# Patient Record
Sex: Female | Born: 1945 | Race: White | Hispanic: No | Marital: Married | Smoking: Never smoker
Health system: Southern US, Community
[De-identification: ages and names within clinical notes are randomized; demographics above are authoritative.]

## PROBLEM LIST (undated history)

## (undated) DIAGNOSIS — I213 ST elevation (STEMI) myocardial infarction of unspecified site: Secondary | ICD-10-CM

## (undated) DIAGNOSIS — I10 Essential (primary) hypertension: Secondary | ICD-10-CM

## (undated) DIAGNOSIS — E785 Hyperlipidemia, unspecified: Secondary | ICD-10-CM

## (undated) DIAGNOSIS — I5032 Chronic diastolic (congestive) heart failure: Secondary | ICD-10-CM

## (undated) DIAGNOSIS — E119 Type 2 diabetes mellitus without complications: Secondary | ICD-10-CM

## (undated) DIAGNOSIS — I2583 Coronary atherosclerosis due to lipid rich plaque: Secondary | ICD-10-CM

## (undated) DIAGNOSIS — I214 Non-ST elevation (NSTEMI) myocardial infarction: Secondary | ICD-10-CM

## (undated) DIAGNOSIS — I2510 Atherosclerotic heart disease of native coronary artery without angina pectoris: Secondary | ICD-10-CM

## (undated) HISTORY — DX: Essential (primary) hypertension: I10

## (undated) HISTORY — DX: Atherosclerotic heart disease of native coronary artery without angina pectoris: I25.10

## (undated) HISTORY — DX: Coronary atherosclerosis due to lipid rich plaque: I25.83

## (undated) HISTORY — DX: ST elevation (STEMI) myocardial infarction of unspecified site: I21.3

## (undated) HISTORY — DX: Type 2 diabetes mellitus without complications: E11.9

## (undated) HISTORY — DX: Hyperlipidemia, unspecified: E78.5

## (undated) HISTORY — DX: Chronic diastolic (congestive) heart failure: I50.32

## (undated) HISTORY — DX: Non-ST elevation (NSTEMI) myocardial infarction: I21.4

---

## 1999-03-21 ENCOUNTER — Other Ambulatory Visit: Admission: RE | Admit: 1999-03-21 | Discharge: 1999-03-21 | Payer: Self-pay | Admitting: *Deleted

## 2000-03-25 ENCOUNTER — Encounter: Admission: RE | Admit: 2000-03-25 | Discharge: 2000-03-25 | Payer: Self-pay | Admitting: Neurosurgery

## 2000-03-25 ENCOUNTER — Encounter: Payer: Self-pay | Admitting: Neurosurgery

## 2000-06-03 ENCOUNTER — Other Ambulatory Visit: Admission: RE | Admit: 2000-06-03 | Discharge: 2000-06-03 | Payer: Self-pay | Admitting: *Deleted

## 2002-07-06 DIAGNOSIS — I213 ST elevation (STEMI) myocardial infarction of unspecified site: Secondary | ICD-10-CM

## 2002-07-06 HISTORY — DX: ST elevation (STEMI) myocardial infarction of unspecified site: I21.3

## 2009-01-05 ENCOUNTER — Emergency Department: Payer: Self-pay | Admitting: Emergency Medicine

## 2017-07-05 DIAGNOSIS — E7800 Pure hypercholesterolemia, unspecified: Secondary | ICD-10-CM | POA: Insufficient documentation

## 2017-07-05 DIAGNOSIS — I10 Essential (primary) hypertension: Secondary | ICD-10-CM | POA: Insufficient documentation

## 2017-10-27 HISTORY — PX: CORONARY ARTERY BYPASS GRAFT: SHX141

## 2017-11-03 ENCOUNTER — Encounter
Admission: RE | Admit: 2017-11-03 | Discharge: 2017-11-03 | Disposition: A | Discharge status: Home / Self Care | Payer: Self-pay | Source: Ambulatory Visit | Attending: Internal Medicine | Admitting: Internal Medicine

## 2017-11-04 MED ORDER — TRAMADOL HCL 50 MG PO TABS
25.00 | ORAL_TABLET | ORAL | Status: DC
Start: ? — End: 2017-11-04

## 2017-11-04 MED ORDER — ATORVASTATIN CALCIUM 40 MG PO TABS
40.00 | ORAL_TABLET | ORAL | Status: DC
Start: 2017-11-05 — End: 2017-11-04

## 2017-11-04 MED ORDER — LIDOCAINE 5 % EX PTCH
2.00 | MEDICATED_PATCH | CUTANEOUS | Status: DC
Start: 2017-11-04 — End: 2017-11-04

## 2017-11-04 MED ORDER — METFORMIN HCL ER 500 MG PO TB24
1000.00 | ORAL_TABLET | ORAL | Status: DC
Start: 2017-11-04 — End: 2017-11-04

## 2017-11-04 MED ORDER — METOPROLOL TARTRATE 25 MG PO TABS
25.00 | ORAL_TABLET | ORAL | Status: DC
Start: 2017-11-04 — End: 2017-11-04

## 2017-11-04 MED ORDER — PANTOPRAZOLE SODIUM 40 MG PO TBEC
40.00 | DELAYED_RELEASE_TABLET | ORAL | Status: DC
Start: 2017-11-05 — End: 2017-11-04

## 2017-11-04 MED ORDER — GLUCAGON HCL RDNA (DIAGNOSTIC) 1 MG IJ SOLR
1.00 | INTRAMUSCULAR | Status: DC
Start: ? — End: 2017-11-04

## 2017-11-04 MED ORDER — FUROSEMIDE 20 MG PO TABS
20.00 | ORAL_TABLET | ORAL | Status: DC
Start: 2017-11-04 — End: 2017-11-04

## 2017-11-04 MED ORDER — ASPIRIN 81 MG PO CHEW
81.00 | CHEWABLE_TABLET | ORAL | Status: DC
Start: 2017-11-05 — End: 2017-11-04

## 2017-11-04 MED ORDER — POTASSIUM CHLORIDE ER 20 MEQ PO TBCR
20.00 | EXTENDED_RELEASE_TABLET | ORAL | Status: DC
Start: 2017-11-04 — End: 2017-11-04

## 2017-11-04 MED ORDER — DEXTROSE 50 % IV SOLN
12.50 | INTRAVENOUS | Status: DC
Start: ? — End: 2017-11-04

## 2017-11-04 MED ORDER — INSULIN REGULAR HUMAN 100 UNIT/ML IJ SOLN
.00 | INTRAMUSCULAR | Status: DC
Start: 2017-11-04 — End: 2017-11-04

## 2017-11-04 MED ORDER — ACETAMINOPHEN 325 MG PO TABS
650.00 | ORAL_TABLET | ORAL | Status: DC
Start: 2017-11-04 — End: 2017-11-04

## 2017-11-04 MED ORDER — MAGNESIUM OXIDE 400 MG PO TABS
400.00 | ORAL_TABLET | ORAL | Status: DC
Start: 2017-11-04 — End: 2017-11-04

## 2017-11-04 MED ORDER — GENERIC EXTERNAL MEDICATION
Status: DC
Start: ? — End: 2017-11-04

## 2017-11-05 ENCOUNTER — Other Ambulatory Visit: Payer: Self-pay

## 2017-11-05 MED ORDER — TRAMADOL HCL 50 MG PO TABS: 25.0000 mg | ORAL_TABLET | Freq: Four times a day (QID) | ORAL | 0 refills | Status: DC | PRN

## 2017-11-05 NOTE — Telephone Encounter (Signed)
Rx sent to [facility] phone : [phone] , fax : [phone]  

## 2017-11-09 MED ORDER — METOPROLOL SUCCINATE ER 50 MG PO TB24
50.00 | ORAL_TABLET | ORAL | Status: DC
Start: 2017-11-09 — End: 2017-11-09

## 2017-11-09 MED ORDER — MAGNESIUM OXIDE 400 MG PO TABS
400.00 | ORAL_TABLET | ORAL | Status: DC
Start: 2017-11-09 — End: 2017-11-09

## 2017-11-09 MED ORDER — LIDOCAINE HCL (PF) 1 % IJ SOLN
0.50 | INTRAMUSCULAR | Status: DC
Start: ? — End: 2017-11-09

## 2017-11-09 MED ORDER — APIXABAN 5 MG PO TABS
5.00 | ORAL_TABLET | ORAL | Status: DC
Start: 2017-11-09 — End: 2017-11-09

## 2017-11-09 MED ORDER — FUROSEMIDE 20 MG PO TABS
20.00 | ORAL_TABLET | ORAL | Status: DC
Start: 2017-11-09 — End: 2017-11-09

## 2017-11-09 MED ORDER — DEXTROSE 50 % IV SOLN
12.50 | INTRAVENOUS | Status: DC
Start: ? — End: 2017-11-09

## 2017-11-09 MED ORDER — ACETAMINOPHEN 325 MG PO TABS
650.00 | ORAL_TABLET | ORAL | Status: DC
Start: ? — End: 2017-11-09

## 2017-11-09 MED ORDER — GLUCAGON HCL RDNA (DIAGNOSTIC) 1 MG IJ SOLR
1.00 | INTRAMUSCULAR | Status: DC
Start: ? — End: 2017-11-09

## 2017-11-09 MED ORDER — ASPIRIN EC 81 MG PO TBEC
81.00 | DELAYED_RELEASE_TABLET | ORAL | Status: DC
Start: 2017-11-10 — End: 2017-11-09

## 2017-11-09 MED ORDER — PANTOPRAZOLE SODIUM 40 MG PO TBEC
40.00 | DELAYED_RELEASE_TABLET | ORAL | Status: DC
Start: 2017-11-10 — End: 2017-11-09

## 2017-11-09 MED ORDER — ATORVASTATIN CALCIUM 40 MG PO TABS
40.00 | ORAL_TABLET | ORAL | Status: DC
Start: 2017-11-10 — End: 2017-11-09

## 2017-11-12 ENCOUNTER — Encounter: Payer: Self-pay | Admitting: Gerontology

## 2017-11-12 NOTE — Progress Notes (Signed)
error 

## 2017-11-13 NOTE — Progress Notes (Signed)
This encounter was created in error - please disregard.

## 2017-11-18 ENCOUNTER — Encounter: Payer: Self-pay | Admitting: Gerontology

## 2017-11-18 ENCOUNTER — Non-Acute Institutional Stay (SKILLED_NURSING_FACILITY): Payer: Medicare Other | Admitting: Gerontology

## 2017-11-18 DIAGNOSIS — D7582 Heparin induced thrombocytopenia (HIT): Secondary | ICD-10-CM | POA: Diagnosis not present

## 2017-11-18 DIAGNOSIS — D75829 Heparin-induced thrombocytopenia, unspecified: Secondary | ICD-10-CM

## 2017-11-18 DIAGNOSIS — I25708 Atherosclerosis of coronary artery bypass graft(s), unspecified, with other forms of angina pectoris: Secondary | ICD-10-CM

## 2017-11-18 NOTE — Progress Notes (Signed)
Location:   [facility] [place] [facility] [place] Room Number: 205A Place of Service:  SNF [phone]) Provider:  Toni Arthurs, NP-C  No primary care provider on file.  No care team member to display  Extended Emergency Contact Information Primary Emergency Contact: [facility] Address: [address]          Lorina Rabon  [place] Home Phone: [phone] Relation: None  Code Status:  FULL Goals of care: Advanced Directive information Advanced Directives 11/18/2017  Does Patient Have a Medical Advance Directive? No  Would patient like information on creating a medical advance directive? No - Patient declined     Chief Complaint  Patient presents with  . Medical Management of Chronic Issues    Routine Visit    HPI:  Pt is a 72 y.o. female seen today for medical management of chronic diseases. Pt was admitted to the facility for rehab following MI with subsequent Coronary Artery Bypass Graft x 4 d/t CAD. 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 Pt was then switched to Eliquis. Pt has not had any issues since then. Sternal incision well approximated with Dermabond. Covered with Xeroform gauze. No redness, to drainage, no odor. Pt ambulatory with walkert. VSS. NO other complaints.       Past Medical History:  Diagnosis Date  . Chronic diastolic heart failure ([place])   . Coronary artery disease due to lipid rich plaque   . DM II (diabetes mellitus, type II), controlled ([place])   . HLD (hyperlipidemia)   . HTN (hypertension)   . NSTEMI (non-ST elevated myocardial infarction) ([place])   . STEMI (ST elevation myocardial infarction) ([place]) 2004   s/p PCI to LAD   Past Surgical History:  Procedure Laterality Date  . CESAREAN SECTION     x3  . CORONARY ARTERY BYPASS GRAFT  10/27/2017   Procedure: CORONARY ARTERY BYPASS, USING ARTERIAL GRAFT(S); SINGLE ARTERIAL GRAFT, Left internal mammary artery harvest; Surgeon: Laretta Alstrom, MD; Location: [facility] OPERATING ROOMS;  Service: Cardiothoracic; Laterality: N/A;   . CORONARY ARTERY BYPASS GRAFT  10/27/2017   Procedure: CORONARY ARTERY BYPASS, USING VENOUS GRAFT(S) AND ARTERIAL GRAFT(S);SINGLE VEIN GRAFT (LIST IN ADDITION TO PRIMARY PROCEDURE); Surgeon: Laretta Alstrom, MD; Location: [facility]; Service: Cardiothoracic; Laterality: N/A;     Allergies  Allergen Reactions  . Heparin     Other reaction(s): Other (See Comments) HIT +  . Sulfa Antibiotics     Other reaction(s): Other (See Comments) Cannot recall      Allergies as of 11/18/2017      Reactions   Heparin    Other reaction(s): Other (See Comments) HIT +   Sulfa Antibiotics    Other reaction(s): Other (See Comments) Cannot recall       Medication List        Accurate as of 11/18/17 10:26 AM. Always use your most recent med list.          acetaminophen 325 MG tablet Commonly known as:  TYLENOL Take 650 mg by mouth every 6 (six) hours as needed.   atorvastatin 40 MG tablet Commonly known as:  LIPITOR Take 40 mg by mouth daily.   ELIQUIS 5 MG Tabs tablet Generic drug:  apixaban Take 5 mg by mouth every 12 (twelve) hours.   furosemide 20 MG tablet Commonly known as:  LASIX Take 20 mg by mouth daily.   lisinopril 2.5 MG tablet Commonly known as:  PRINIVIL,ZESTRIL Take 2.5 mg by mouth daily.   magnesium oxide  400 MG tablet Commonly known as:  MAG-OX Take 400 mg by mouth daily.   metFORMIN 500 MG tablet Commonly known as:  GLUCOPHAGE Take 500 mg by mouth 2 (two) times daily with a meal.   metoprolol succinate 50 MG 24 hr tablet Commonly known as:  TOPROL-XL Take 50 mg by mouth every 12 (twelve) hours. Take with or immediately following a meal.   pantoprazole 40 MG tablet Commonly known as:  PROTONIX Take 40 mg by mouth daily.   potassium chloride 20 MEQ packet Commonly known as:  KLOR-CON Take 20 mEq by mouth daily.   traMADol 50 MG tablet Commonly known as:  ULTRAM Take 0.5 tablets (25 mg  total) by mouth every 6 (six) hours as needed. 1/2 tab       Review of Systems  Constitutional: Negative for activity change, appetite change, chills, diaphoresis and fever.  HENT: Negative for congestion, mouth sores, nosebleeds, postnasal drip, sneezing, sore throat, trouble swallowing and voice change.   Respiratory: Negative for apnea, cough, choking, chest tightness, shortness of breath and wheezing.   Cardiovascular: Negative for chest pain, palpitations and leg swelling.  Gastrointestinal: Negative for abdominal distention, abdominal pain, constipation, diarrhea and nausea.  Genitourinary: Negative for difficulty urinating, dysuria, frequency and urgency.  Musculoskeletal: Positive for arthralgias (typical arthritis). Negative for back pain, gait problem and myalgias.  Skin: Positive for wound. Negative for color change, pallor and rash.  Neurological: Positive for weakness. Negative for dizziness, tremors, syncope, speech difficulty, numbness and headaches.  Psychiatric/Behavioral: Negative for agitation and behavioral problems.  All other systems reviewed and are negative.    There is no immunization history on file for this patient. Pertinent  Health Maintenance Due  Topic Date Due  . MAMMOGRAM  01/27/1998  . COLONOSCOPY  01/27/1998  . DEXA SCAN  01/27/2013  . PNA vac Low Risk Adult (1 of 2 - PCV13) 01/27/2013  . INFLUENZA VACCINE  02/03/2018   No flowsheet data found. Functional Status Survey:    Vitals:   11/18/17 1020  BP: 129/64  Pulse: 80  Resp: 20  Temp: 98.1 F (36.7 C)  TempSrc: Oral  SpO2: 97%  Weight: 147 lb 6.4 oz (66.9 kg)  Height: '5\' 4"'$  (1.626 m)   Body mass index is 25.3 kg/m. Physical Exam  Constitutional: She is oriented to person, place, and time. Vital signs are normal. She appears well-developed and well-nourished. She is active and cooperative. She does not appear ill. No distress.  HENT:  Head: Normocephalic and atraumatic.    Mouth/Throat: Uvula is midline, oropharynx is clear and moist and mucous membranes are normal. Mucous membranes are not pale, not dry and not cyanotic.  Eyes: Pupils are equal, round, and reactive to light. Conjunctivae, EOM and lids are normal.  Neck: Trachea normal, normal range of motion and full passive range of motion without pain. Neck supple. No JVD present. No tracheal deviation, no edema and no erythema present. No thyromegaly present.  Cardiovascular: Normal rate, regular rhythm, normal heart sounds, intact distal pulses and normal pulses. Exam reveals no gallop, no distant heart sounds and no friction rub.  No murmur heard. Pulses:      Dorsalis pedis pulses are 2+ on the right side, and 2+ on the left side.  No edema  Pulmonary/Chest: Effort normal and breath sounds normal. No accessory muscle usage. No respiratory distress. She has no decreased breath sounds. She has no wheezes. She has no rhonchi. She has no rales. She exhibits no tenderness.  Abdominal: Soft. Normal appearance and bowel sounds are normal. She exhibits no distension and no ascites. There is no tenderness.  Musculoskeletal: Normal range of motion. She exhibits no edema or tenderness.  Expected osteoarthritis, stiffness; Bilateral Calves soft, supple. Negative Homan's Sign. B- pedal pulses equal  Neurological: She is alert and oriented to person, place, and time. She has normal strength.  Skin: Skin is warm and dry. Laceration (midsternal incision) noted. She is not diaphoretic. No cyanosis. No pallor. Nails show no clubbing.  Psychiatric: She has a normal mood and affect. Her speech is normal and behavior is normal. Judgment and thought content normal. Cognition and memory are normal.  Nursing note and vitals reviewed.   Labs reviewed: No results for input(s): NA, K, CL, CO2, GLUCOSE, BUN, CREATININE, CALCIUM, MG, PHOS in the last 8760 hours. No results for input(s): AST, ALT, ALKPHOS, BILITOT, PROT, ALBUMIN in the  last 8760 hours. No results for input(s): WBC, NEUTROABS, HGB, HCT, MCV, PLT in the last 8760 hours. No results found for: TSH No results found for: HGBA1C No results found for: CHOL, HDL, LDLCALC, LDLDIRECT, TRIG, CHOLHDL  Significant Diagnostic Results in last 30 days:  No results found.  Assessment/Plan  Coronary artery disease involving coronary bypass graft of native heart with other forms of angina pectoris (HCC)  HIT (heparin-induced thrombocytopenia) ([place])   Above listed conditions stable  Continue current medication regimen  Continue Eliquis 5 mg po BID for DVT prophylaxis  Monitor for s/s of PE: chet pain, shortness of breath, low sats, etc  Labs next week  Follow up with cardiology as instructed.  Family/ staff Communication:   Total Time:  Documentation:  Face to Face:  Family/Phone:   Labs/tests ordered:  Cbc, met C on monday  Medication list reviewed and assessed for continued appropriateness. Monthly medication orders reviewed and signed.  Vikki Ports, NP-C Geriatrics [facility] [phone], [place] [address] Cell Phone (Mon-Fri 8am-5pm):  [phone] On Call:  [phone] & follow prompts after 5pm & weekends Office Phone:  [phone] Office Fax:  [phone]

## 2017-11-22 DIAGNOSIS — D7582 Heparin induced thrombocytopenia (HIT): Secondary | ICD-10-CM | POA: Insufficient documentation

## 2017-11-22 DIAGNOSIS — I25810 Atherosclerosis of coronary artery bypass graft(s) without angina pectoris: Secondary | ICD-10-CM | POA: Insufficient documentation

## 2017-11-22 DIAGNOSIS — D75829 Heparin-induced thrombocytopenia, unspecified: Secondary | ICD-10-CM | POA: Insufficient documentation

## 2017-11-23 ENCOUNTER — Other Ambulatory Visit
Admission: RE | Admit: 2017-11-23 | Discharge: 2017-11-23 | Disposition: A | Discharge status: Home / Self Care | Payer: No Typology Code available for payment source | Source: Ambulatory Visit | Attending: Gerontology | Admitting: Gerontology

## 2017-11-23 DIAGNOSIS — I25118 Atherosclerotic heart disease of native coronary artery with other forms of angina pectoris: Secondary | ICD-10-CM | POA: Insufficient documentation

## 2017-11-23 LAB — CBC WITH DIFFERENTIAL/PLATELET
Basophils Absolute: 0.1 10*3/uL (ref 0–0.1)
Basophils Relative: 2 %
Eosinophils Absolute: 0.4 10*3/uL (ref 0–0.7)
Eosinophils Relative: 7 %
HCT: 37.1 % (ref 35.0–47.0)
HEMOGLOBIN: 12.5 g/dL (ref 12.0–16.0)
LYMPHS ABS: 1.9 10*3/uL (ref 1.0–3.6)
LYMPHS PCT: 36 %
MCH: 31.3 pg (ref 26.0–34.0)
MCHC: 33.6 g/dL (ref 32.0–36.0)
MCV: 93.2 fL (ref 80.0–100.0)
Monocytes Absolute: 0.5 10*3/uL (ref 0.2–0.9)
Monocytes Relative: 9 %
Neutro Abs: 2.5 10*3/uL (ref 1.4–6.5)
Neutrophils Relative %: 46 %
Platelets: 268 10*3/uL (ref 150–440)
RBC: 3.99 MIL/uL (ref 3.80–5.20)
RDW: 14.6 % — ABNORMAL HIGH (ref 11.5–14.5)
WBC: 5.3 10*3/uL (ref 3.6–11.0)

## 2017-11-23 LAB — COMPREHENSIVE METABOLIC PANEL
ALK PHOS: 98 U/L (ref 38–126)
ALT: 16 U/L (ref 14–54)
AST: 20 U/L (ref 15–41)
Albumin: 4.2 g/dL (ref 3.5–5.0)
Anion gap: 9 (ref 5–15)
BUN: 13 mg/dL (ref 6–20)
CALCIUM: 9.3 mg/dL (ref 8.9–10.3)
CO2: 28 mmol/L (ref 22–32)
CREATININE: 0.77 mg/dL (ref 0.44–1.00)
Chloride: 101 mmol/L (ref 101–111)
Glucose, Bld: 176 mg/dL — ABNORMAL HIGH (ref 65–99)
Potassium: 3.6 mmol/L (ref 3.5–5.1)
SODIUM: 138 mmol/L (ref 135–145)
Total Bilirubin: 1.1 mg/dL (ref 0.3–1.2)
Total Protein: 7.1 g/dL (ref 6.5–8.1)

## 2018-01-03 ENCOUNTER — Encounter: Payer: Medicare Other | Attending: Cardiovascular Disease | Admitting: *Deleted

## 2018-01-03 ENCOUNTER — Encounter: Payer: Self-pay | Admitting: *Deleted

## 2018-01-03 VITALS — Ht 63.4 in | Wt 140.6 lb

## 2018-01-03 DIAGNOSIS — I2510 Atherosclerotic heart disease of native coronary artery without angina pectoris: Secondary | ICD-10-CM | POA: Insufficient documentation

## 2018-01-03 DIAGNOSIS — Z951 Presence of aortocoronary bypass graft: Secondary | ICD-10-CM | POA: Insufficient documentation

## 2018-01-03 DIAGNOSIS — I2583 Coronary atherosclerosis due to lipid rich plaque: Secondary | ICD-10-CM | POA: Insufficient documentation

## 2018-01-03 DIAGNOSIS — E785 Hyperlipidemia, unspecified: Secondary | ICD-10-CM | POA: Insufficient documentation

## 2018-01-03 DIAGNOSIS — I110 Hypertensive heart disease with heart failure: Secondary | ICD-10-CM | POA: Insufficient documentation

## 2018-01-03 DIAGNOSIS — I252 Old myocardial infarction: Secondary | ICD-10-CM | POA: Insufficient documentation

## 2018-01-03 DIAGNOSIS — E119 Type 2 diabetes mellitus without complications: Secondary | ICD-10-CM | POA: Insufficient documentation

## 2018-01-03 DIAGNOSIS — I5032 Chronic diastolic (congestive) heart failure: Secondary | ICD-10-CM | POA: Insufficient documentation

## 2018-01-03 NOTE — Patient Instructions (Signed)
Patient Instructions  Patient Details  Name: Melanie Wolfe MRN: [id number] Date of Birth: 1945/08/03 Referring Provider:  Francesco Runner, MD  Below are your personal goals for exercise, nutrition, and risk factors. Our goal is to help you stay on track towards obtaining and maintaining these goals. We will be discussing your progress on these goals with you throughout the program.  Initial Exercise Prescription: Initial Exercise Prescription - 01/03/18 1500      Date of Initial Exercise RX and Referring Provider   Date  01/03/18    Referring Provider  Youlanda Mighty MD      Treadmill   MPH  2.3    Grade  0.5    Minutes  15    METs  2.92      NuStep   Level  1    SPM  80    Minutes  15    METs  2.5      Arm Ergometer   Level  2    Watts  33    RPM  25    Minutes  15    METs  2.5      Prescription Details   Frequency (times per week)  2    Duration  Progress to 30 minutes of continuous aerobic without signs/symptoms of physical distress      Intensity   THRR 40-80% of Max Heartrate  104-135    Ratings of Perceived Exertion  11-13    Perceived Dyspnea  0-4      Progression   Progression  Continue to progress workloads to maintain intensity without signs/symptoms of physical distress.      Resistance Training   Training Prescription  Yes    Weight  3 lbs       Exercise Goals: Frequency: Be able to perform aerobic exercise two to three times per week in program working toward 2-5 days per week of home exercise.  Intensity: Work with a perceived exertion of 11 (fairly light) - 15 (hard) while following your exercise prescription.  We will make changes to your prescription with you as you progress through the program.   Duration: Be able to do 30 to 45 minutes of continuous aerobic exercise in addition to a 5 minute warm-up and a 5 minute cool-down routine.   Nutrition Goals: Your personal nutrition goals will be established when you do your nutrition  analysis with the dietician.  The following are general nutrition guidelines to follow: Cholesterol < /day Sodium < /day Fiber: Women over 50 yrs - 21 grams per day  Personal Goals: Personal Goals and Risk Factors at Admission - 01/03/18 1403      Core Components/Risk Factors/Patient Goals on Admission    Weight Management  Yes;Weight Maintenance    Intervention  Weight Management: Develop a combined nutrition and exercise program designed to reach desired caloric intake, while maintaining appropriate intake of nutrient and fiber, sodium and fats, and appropriate energy expenditure required for the weight goal.;Weight Management: Provide education and appropriate resources to help participant work on and attain dietary goals.    Admit Weight  140 lb 9.6 oz (63.8 kg)    Expected Outcomes  Weight Maintenance: Understanding of the daily nutrition guidelines, which includes 25-35% calories from fat, 7% or less cal from saturated fats, less than  cholesterol, less than 1.5gm of sodium, & 5 or more servings of fruits and vegetables daily    Diabetes  Yes    Intervention  Provide education about  signs/symptoms and action to take for hypo/hyperglycemia.;Provide education about proper nutrition, including hydration, and aerobic/resistive exercise prescription along with prescribed medications to achieve blood glucose in normal ranges: Fasting glucose 65-99 mg/dL    Expected Outcomes  Short Term: Participant verbalizes understanding of the signs/symptoms and immediate care of hyper/hypoglycemia, proper foot care and importance of medication, aerobic/resistive exercise and nutrition plan for blood glucose control.;Long Term: Attainment of HbA1C < 7%.    Heart Failure  Yes    Intervention  Provide a combined exercise and nutrition program that is supplemented with education, support and counseling about heart failure. Directed toward relieving symptoms such as shortness of breath, decreased  exercise tolerance, and extremity edema.    Expected Outcomes  Improve functional capacity of life;Short term: Attendance in program 2-3 days a week with increased exercise capacity. Reported lower sodium intake. Reported increased fruit and vegetable intake. Reports medication compliance.;Short term: Daily weights obtained and reported for increase. Utilizing diuretic protocols set by physician.;Long term: Adoption of self-care skills and reduction of barriers for early signs and symptoms recognition and intervention leading to self-care maintenance.    Hypertension  Yes    Intervention  Provide education on lifestyle modifcations including regular physical activity/exercise, weight management, moderate sodium restriction and increased consumption of fresh fruit, vegetables, and low fat dairy, alcohol moderation, and smoking cessation.;Monitor prescription use compliance.    Expected Outcomes  Short Term: Continued assessment and intervention until BP is < 140/59mm HG in hypertensive participants. < 130/27mm HG in hypertensive participants with diabetes, heart failure or chronic kidney disease.;Long Term: Maintenance of blood pressure at goal levels.    Lipids  Yes    Intervention  Provide education and support for participant on nutrition & aerobic/resistive exercise along with prescribed medications to achieve LDL 70mg , HDL >40mg .    Expected Outcomes  Short Term: Participant states understanding of desired cholesterol values and is compliant with medications prescribed. Participant is following exercise prescription and nutrition guidelines.;Long Term: Cholesterol controlled with medications as prescribed, with individualized exercise RX and with personalized nutrition plan. Value goals: LDL < , HDL > 40 mg.       Tobacco Use Initial Evaluation: Social History   Tobacco Use  Smoking Status Never Smoker  Smokeless Tobacco Never Used    Exercise Goals and Review: Exercise Goals    Row  Name 01/03/18 1530             Exercise Goals   Increase Physical Activity  Yes       Intervention  Provide advice, education, support and counseling about physical activity/exercise needs.;Develop an individualized exercise prescription for aerobic and resistive training based on initial evaluation findings, risk stratification, comorbidities and participant's personal goals.       Expected Outcomes  Short Term: Attend rehab on a regular basis to increase amount of physical activity.;Long Term: Add in home exercise to make exercise part of routine and to increase amount of physical activity.;Long Term: Exercising regularly at least 3-5 days a week.       Increase Strength and Stamina  Yes       Intervention  Develop an individualized exercise prescription for aerobic and resistive training based on initial evaluation findings, risk stratification, comorbidities and participant's personal goals.;Provide advice, education, support and counseling about physical activity/exercise needs.       Expected Outcomes  Short Term: Increase workloads from initial exercise prescription for resistance, speed, and METs.;Short Term: Perform resistance training exercises routinely during rehab and add in  resistance training at home;Long Term: Improve cardiorespiratory fitness, muscular endurance and strength as measured by increased METs and functional capacity ( )       Able to understand and use rate of perceived exertion (RPE) scale  Yes       Intervention  Provide education and explanation on how to use RPE scale       Expected Outcomes  Short Term: Able to use RPE daily in rehab to express subjective intensity level;Long Term:  Able to use RPE to guide intensity level when exercising independently       Knowledge and understanding of Target Heart Rate Range (THRR)  Yes       Intervention  Provide education and explanation of THRR including how the numbers were predicted and where they are located for  reference       Expected Outcomes  Short Term: Able to use daily as guideline for intensity in rehab;Short Term: Able to state/look up THRR;Long Term: Able to use THRR to govern intensity when exercising independently       Able to check pulse independently  Yes       Intervention  Provide education and demonstration on how to check pulse in carotid and radial arteries.;Review the importance of being able to check your own pulse for safety during independent exercise       Expected Outcomes  Short Term: Able to explain why pulse checking is important during independent exercise;Long Term: Able to check pulse independently and accurately       Understanding of Exercise Prescription  Yes       Intervention  Provide education, explanation, and written materials on patient's individual exercise prescription       Expected Outcomes  Short Term: Able to explain program exercise prescription;Long Term: Able to explain home exercise prescription to exercise independently          Copy of goals given to participant.

## 2018-01-03 NOTE — Progress Notes (Signed)
Cardiac Individual Treatment Plan  Patient Details  Name: Melanie Wolfe MRN: [id number] Date of Birth: [birth year] Referring Provider:     Cardiac Rehab from 01/03/2018 in [facility]  Referring Provider  Cloretta Ned MD      Initial Encounter Date:    Cardiac Rehab from 01/03/2018 in [facility] Cardiac and Pulmonary Rehab  Date  01/03/18      Visit Diagnosis: S/P CABG x 4  Patient's Home Medications on Admission:  Current Outpatient Medications:  .  acetaminophen (TYLENOL) 325 MG tablet, Take 650 mg by mouth every 6 (six) hours as needed., Disp: , Rfl:  .  aspirin EC 81 MG tablet, Take 81 mg by mouth daily., Disp: , Rfl:  .  atorvastatin (LIPITOR) 40 MG tablet, Take 40 mg by mouth daily., Disp: , Rfl:  .  ezetimibe (ZETIA) 10 MG tablet, Take by mouth., Disp: , Rfl:  .  lisinopril (PRINIVIL,ZESTRIL) 2.5 MG tablet, Take 2.5 mg by mouth daily., Disp: , Rfl:  .  metFORMIN (GLUCOPHAGE) 500 MG tablet, Take 500 mg by mouth 2 (two) times daily with a meal., Disp: , Rfl:  .  metoprolol succinate (TOPROL-XL) 50 MG 24 hr tablet, Take 50 mg by mouth every 12 (twelve) hours. Take with or immediately following a meal., Disp: , Rfl:  .  pantoprazole (PROTONIX) 40 MG tablet, Take 40 mg by mouth daily., Disp: , Rfl:  .  apixaban (ELIQUIS) 5 MG TABS tablet, Take 5 mg by mouth every 12 (twelve) hours., Disp: , Rfl:  .  traMADol (ULTRAM) 50 MG tablet, Take 0.5 tablets (25 mg total) by mouth every 6 (six) hours as needed. 1/2 tab (Patient not taking: Reported on 01/03/2018), Disp: 30 tablet, Rfl: 0  Past Medical History: Past Medical History:  Diagnosis Date  . Chronic diastolic heart failure ([place])   . Coronary artery disease due to lipid rich plaque   . DM II (diabetes mellitus, type II), controlled ([place])   . HLD (hyperlipidemia)   . HTN (hypertension)   . NSTEMI (non-ST elevated myocardial infarction) ([place])   . STEMI (ST elevation myocardial infarction) ([place]) 2004   s/p PCI  to LAD    Tobacco Use: Social History   Tobacco Use  Smoking Status Never Smoker  Smokeless Tobacco Never Used    Labs: Recent Review Flowsheet Data    There is no flowsheet data to display.       Exercise Target Goals: Date: 01/03/18  Exercise Program Goal: Individual exercise prescription set using results from initial 6 min walk test and THRR while considering  patient's activity barriers and safety.   Exercise Prescription Goal: Initial exercise prescription builds to 30-45 minutes a day of aerobic activity, 2-3 days per week.  Home exercise guidelines will be given to patient during program as part of exercise prescription that the participant will acknowledge.  Activity Barriers & Risk Stratification: Activity Barriers & Cardiac Risk Stratification - 01/03/18 1401      Activity Barriers & Cardiac Risk Stratification   Activity Barriers  Deconditioning;Muscular Weakness    Cardiac Risk Stratification  High History of MI       6 Minute Walk: 6 Minute Walk    Row Name 01/03/18 1524         6 Minute Walk   Phase  Initial     Distance  1245 feet     Walk Time  6 minutes     # of Rest Breaks  0  MPH  2.36     METS  2.97     RPE  11     VO2 Peak  10.41     Symptoms  No     Resting HR  73 bpm     Resting BP  126/64     Resting Oxygen Saturation   98 %     Exercise Oxygen Saturation  during 6 min walk  96 %     Max Ex. HR  93 bpm     Max Ex. BP  146/66     2 Minute Post BP  124/62        Oxygen Initial Assessment:   Oxygen Re-Evaluation:   Oxygen Discharge (Final Oxygen Re-Evaluation):   Initial Exercise Prescription: Initial Exercise Prescription - 01/03/18 1500      Date of Initial Exercise RX and Referring Provider   Date  01/03/18    Referring Provider  Cloretta Ned MD      Treadmill   MPH  2.3    Grade  0.5    Minutes  15    METs  2.92      NuStep   Level  1    SPM  80    Minutes  15    METs  2.5      Arm Ergometer    Level  2    Watts  33    RPM  25    Minutes  15    METs  2.5      Prescription Details   Frequency (times per week)  2    Duration  Progress to 30 minutes of continuous aerobic without signs/symptoms of physical distress      Intensity   THRR 40-80% of Max Heartrate  104-135    Ratings of Perceived Exertion  11-13    Perceived Dyspnea  0-4      Progression   Progression  Continue to progress workloads to maintain intensity without signs/symptoms of physical distress.      Resistance Training   Training Prescription  Yes    Weight  3 lbs       Perform Capillary Blood Glucose checks as needed.  Exercise Prescription Changes: Exercise Prescription Changes    Row Name 01/03/18 1400             Response to Exercise   Blood Pressure (Admit)  126/64       Blood Pressure (Exercise)  146/66       Blood Pressure (Exit)  124/62       Heart Rate (Admit)  73 bpm       Heart Rate (Exercise)  98 bpm       Heart Rate (Exit)  80 bpm       Oxygen Saturation (Admit)  98 %       Oxygen Saturation (Exercise)  96 %       Rating of Perceived Exertion (Exercise)  11       Symptoms  none       Comments  walk test results          Exercise Comments:   Exercise Goals and Review: Exercise Goals    Row Name 01/03/18 1530             Exercise Goals   Increase Physical Activity  Yes       Intervention  Provide advice, education, support and counseling about physical activity/exercise needs.;Develop an individualized exercise prescription for aerobic and resistive  training based on initial evaluation findings, risk stratification, comorbidities and participant's personal goals.       Expected Outcomes  Short Term: Attend rehab on a regular basis to increase amount of physical activity.;Long Term: Add in home exercise to make exercise part of routine and to increase amount of physical activity.;Long Term: Exercising regularly at least 3-5 days a week.       Increase Strength and  Stamina  Yes       Intervention  Develop an individualized exercise prescription for aerobic and resistive training based on initial evaluation findings, risk stratification, comorbidities and participant's personal goals.;Provide advice, education, support and counseling about physical activity/exercise needs.       Expected Outcomes  Short Term: Increase workloads from initial exercise prescription for resistance, speed, and METs.;Short Term: Perform resistance training exercises routinely during rehab and add in resistance training at home;Long Term: Improve cardiorespiratory fitness, muscular endurance and strength as measured by increased METs and functional capacity (6MWT)       Able to understand and use rate of perceived exertion (RPE) scale  Yes       Intervention  Provide education and explanation on how to use RPE scale       Expected Outcomes  Short Term: Able to use RPE daily in rehab to express subjective intensity level;Long Term:  Able to use RPE to guide intensity level when exercising independently       Knowledge and understanding of Target Heart Rate Range (THRR)  Yes       Intervention  Provide education and explanation of THRR including how the numbers were predicted and where they are located for reference       Expected Outcomes  Short Term: Able to use daily as guideline for intensity in rehab;Short Term: Able to state/look up THRR;Long Term: Able to use THRR to govern intensity when exercising independently       Able to check pulse independently  Yes       Intervention  Provide education and demonstration on how to check pulse in carotid and radial arteries.;Review the importance of being able to check your own pulse for safety during independent exercise       Expected Outcomes  Short Term: Able to explain why pulse checking is important during independent exercise;Long Term: Able to check pulse independently and accurately       Understanding of Exercise Prescription  Yes        Intervention  Provide education, explanation, and written materials on patient's individual exercise prescription       Expected Outcomes  Short Term: Able to explain program exercise prescription;Long Term: Able to explain home exercise prescription to exercise independently          Exercise Goals Re-Evaluation :   Discharge Exercise Prescription (Final Exercise Prescription Changes): Exercise Prescription Changes - 01/03/18 1400      Response to Exercise   Blood Pressure (Admit)  126/64    Blood Pressure (Exercise)  146/66    Blood Pressure (Exit)  124/62    Heart Rate (Admit)  73 bpm    Heart Rate (Exercise)  98 bpm    Heart Rate (Exit)  80 bpm    Oxygen Saturation (Admit)  98 %    Oxygen Saturation (Exercise)  96 %    Rating of Perceived Exertion (Exercise)  11    Symptoms  none    Comments  walk test results       Nutrition:  Target  Goals: Understanding of nutrition guidelines, daily intake of sodium <1558m, cholesterol <2065m calories 30% from fat and 7% or less from saturated fats, daily to have 5 or more servings of fruits and vegetables.  Biometrics: Pre Biometrics - 01/03/18 1530      Pre Biometrics   Height  5' 3.4" (1.61 m)    Weight  140 lb 9.6 oz (63.8 kg)    Waist Circumference  32.25 inches    Hip Circumference  38.75 inches    Waist to Hip Ratio  0.83 %    BMI (Calculated)  24.6    Single Leg Stand  30 seconds        Nutrition Therapy Plan and Nutrition Goals: Nutrition Therapy & Goals - 01/03/18 1403      Intervention Plan   Intervention  Prescribe, educate and counsel regarding individualized specific dietary modifications aiming towards targeted core components such as weight, hypertension, lipid management, diabetes, heart failure and other comorbidities.    Expected Outcomes  Short Term Goal: Understand basic principles of dietary content, such as calories, fat, sodium, cholesterol and nutrients.;Short Term Goal: A plan has been developed  with personal nutrition goals set during dietitian appointment.;Long Term Goal: Adherence to prescribed nutrition plan.       Nutrition Assessments: Nutrition Assessments - 01/03/18 1055      MEDFICTS Scores   Pre Score  7       Nutrition Goals Re-Evaluation:   Nutrition Goals Discharge (Final Nutrition Goals Re-Evaluation):   Psychosocial: Target Goals: Acknowledge presence or absence of significant depression and/or stress, maximize coping skills, provide positive support system. Participant is able to verbalize types and ability to use techniques and skills needed for reducing stress and depression.   Initial Review & Psychosocial Screening: Initial Psych Review & Screening - 01/03/18 1403      Initial Review   Current issues with  None Identified      Family Dynamics   Good Support System?  Yes Children      Barriers   Psychosocial barriers to participate in program  There are no identifiable barriers or psychosocial needs.;The patient should benefit from training in stress management and relaxation.      Screening Interventions   Interventions  Encouraged to exercise;Provide feedback about the scores to participant;To provide support and resources with identified psychosocial needs    Expected Outcomes  Short Term goal: Utilizing psychosocial counselor, staff and physician to assist with identification of specific Stressors or current issues interfering with healing process. Setting desired goal for each stressor or current issue identified.;Long Term Goal: Stressors or current issues are controlled or eliminated.;Short Term goal: Identification and review with participant of any Quality of Life or Depression concerns found by scoring the questionnaire.;Long Term goal: The participant improves quality of Life and PHQ9 Scores as seen by post scores and/or verbalization of changes       Quality of Life Scores:  Quality of Life - 01/03/18 1056      Quality of Life    Select  Quality of Life      Quality of Life Scores   Health/Function Pre  19.23 %    Socioeconomic Pre  17.63 %    Psych/Spiritual Pre  24.43 %    Family Pre  25.3 %    GLOBAL Pre  20.77 %      Scores of 19 and below usually indicate a poorer quality of life in these areas.  A difference of  2-3 points is  a clinically meaningful difference.  A difference of 2-3 points in the total score of the Quality of Life Index has been associated with significant improvement in overall quality of life, self-image, physical symptoms, and general health in studies assessing change in quality of life.  PHQ-9: Recent Review Flowsheet Data    Depression screen [facility] 2/9 01/03/2018   Decreased Interest 0   Down, Depressed, Hopeless 0   PHQ - 2 Score 0   Altered sleeping 1   Tired, decreased energy 0   Change in appetite 0   Feeling bad or failure about yourself  0   Trouble concentrating 0   Moving slowly or fidgety/restless 0   Suicidal thoughts 0   PHQ-9 Score 1   Difficult doing work/chores Not difficult at all     Interpretation of Total Score  Total Score Depression Severity:  1-4 = Minimal depression, 5-9 = Mild depression, 10-14 = Moderate depression, 15-19 = Moderately severe depression, 20-27 = Severe depression   Psychosocial Evaluation and Intervention:   Psychosocial Re-Evaluation:   Psychosocial Discharge (Final Psychosocial Re-Evaluation):   Vocational Rehabilitation: Provide vocational rehab assistance to qualifying candidates.   Vocational Rehab Evaluation & Intervention: Vocational Rehab - 01/03/18 1411      Initial Vocational Rehab Evaluation & Intervention   Assessment shows need for Vocational Rehabilitation  No       Education: Education Goals: Education classes will be provided on a variety of topics geared toward better understanding of heart health and risk factor modification. Participant will state understanding/return demonstration of topics presented as  noted by education test scores.  Learning Barriers/Preferences: Learning Barriers/Preferences - 01/03/18 1411      Learning Barriers/Preferences   Learning Barriers  None    Learning Preferences  None       Education Topics:  AED/CPR: - Group verbal and written instruction with the use of models to demonstrate the basic use of the AED with the basic ABC's of resuscitation.   General Nutrition Guidelines/Fats and Fiber: -Group instruction provided by verbal, written material, models and posters to present the general guidelines for heart healthy nutrition. Gives an explanation and review of dietary fats and fiber.   Controlling Sodium/Reading Food Labels: -Group verbal and written material supporting the discussion of sodium use in heart healthy nutrition. Review and explanation with models, verbal and written materials for utilization of the food label.   Exercise Physiology & General Exercise Guidelines: - Group verbal and written instruction with models to review the exercise physiology of the cardiovascular system and associated critical values. Provides general exercise guidelines with specific guidelines to those with heart or lung disease.    Aerobic Exercise & Resistance Training: - Gives group verbal and written instruction on the various components of exercise. Focuses on aerobic and resistive training programs and the benefits of this training and how to safely progress through these programs..   Flexibility, Balance, Mind/Body Relaxation: Provides group verbal/written instruction on the benefits of flexibility and balance training, including mind/body exercise modes such as yoga, pilates and tai chi.  Demonstration and skill practice provided.   Stress and Anxiety: - Provides group verbal and written instruction about the health risks of elevated stress and causes of high stress.  Discuss the correlation between heart/lung disease and anxiety and treatment options.  Review healthy ways to manage with stress and anxiety.   Depression: - Provides group verbal and written instruction on the correlation between heart/lung disease and depressed mood, treatment options, and the  stigmas associated with seeking treatment.   Anatomy & Physiology of the Heart: - Group verbal and written instruction and models provide basic cardiac anatomy and physiology, with the coronary electrical and arterial systems. Review of Valvular disease and Heart Failure   Cardiac Procedures: - Group verbal and written instruction to review commonly prescribed medications for heart disease. Reviews the medication, class of the drug, and side effects. Includes the steps to properly store meds and maintain the prescription regimen. (beta blockers and nitrates)   Cardiac Medications I: - Group verbal and written instruction to review commonly prescribed medications for heart disease. Reviews the medication, class of the drug, and side effects. Includes the steps to properly store meds and maintain the prescription regimen.   Cardiac Medications II: -Group verbal and written instruction to review commonly prescribed medications for heart disease. Reviews the medication, class of the drug, and side effects. (all other drug classes)    Go Sex-Intimacy & Heart Disease, Get SMART - Goal Setting: - Group verbal and written instruction through game format to discuss heart disease and the return to sexual intimacy. Provides group verbal and written material to discuss and apply goal setting through the application of the S.M.A.R.T. Method.   Other Matters of the Heart: - Provides group verbal, written materials and models to describe Stable Angina and Peripheral Artery. Includes description of the disease process and treatment options available to the cardiac patient.   Exercise & Equipment Safety: - Individual verbal instruction and demonstration of equipment use and safety with use of  the equipment.   Cardiac Rehab from 01/03/2018 in [facility] Cardiac and Pulmonary Rehab  Date  01/03/18  Educator  [facility]  Instruction Review Code  1- Verbalizes Understanding      Infection Prevention: - Provides verbal and written material to individual with discussion of infection control including proper hand washing and proper equipment cleaning during exercise session.   Cardiac Rehab from 01/03/2018 in [facility] Cardiac and Pulmonary Rehab  Date  01/03/18  Educator  [facility]  Instruction Review Code  1- Verbalizes Understanding      Falls Prevention: - Provides verbal and written material to individual with discussion of falls prevention and safety.   Cardiac Rehab from 01/03/2018 in [facility] Cardiac and Pulmonary Rehab  Date  01/03/18  Educator  [facility]  Instruction Review Code  1- Verbalizes Understanding      Diabetes: - Individual verbal and written instruction to review signs/symptoms of diabetes, desired ranges of glucose level fasting, after meals and with exercise. Acknowledge that pre and post exercise glucose checks will be done for 3 sessions at entry of program.   Cardiac Rehab from 01/03/2018 in [facility] Cardiac [facility] Pulmonary Rehab  Date  01/03/18  Educator  [facility]  Instruction Review Code  1- Verbalizes Understanding      Know Your Numbers and Risk Factors: -Group verbal and written instruction about important numbers in your health.  Discussion of what are risk factors and how they play a role in the disease process.  Review of Cholesterol, Blood Pressure, Diabetes, and BMI and the role they play in your overall health.   Sleep Hygiene: -Provides group verbal and written instruction about how sleep can affect your health.  Define sleep hygiene, discuss sleep cycles and impact of sleep habits. Review good sleep hygiene tips.    Other: -Provides group and verbal instruction on various topics (see comments)   Knowledge Questionnaire Score: Knowledge Questionnaire Score - 01/03/18 1055  Knowledge Questionnaire Score   Pre Score  22/26 test reviewed with pt today  Education [place]: Angina, Nutrition       Core Components/Risk Factors/Patient Goals at Admission: Personal Goals and Risk Factors at Admission - 01/03/18 1403      Core Components/Risk Factors/Patient Goals on Admission    Weight Management  Yes;Weight Maintenance    Intervention  Weight Management: Develop a combined nutrition and exercise program designed to reach desired caloric intake, while maintaining appropriate intake of nutrient and fiber, sodium and fats, and appropriate energy expenditure required for the weight goal.;Weight Management: Provide education and appropriate resources to help participant work on and attain dietary goals.    Admit Weight  140 lb 9.6 oz (63.8 kg)    Expected Outcomes  Weight Maintenance: Understanding of the daily nutrition guidelines, which includes 25-35% calories from fat, 7% or less cal from saturated fats, less than 233m cholesterol, less than 1.5gm of sodium, & 5 or more servings of fruits and vegetables daily    Diabetes  Yes    Intervention  Provide education about signs/symptoms and action to take for hypo/hyperglycemia.;Provide education about proper nutrition, including hydration, and aerobic/resistive exercise prescription along with prescribed medications to achieve blood glucose in normal ranges: Fasting glucose 65-99 mg/dL    Expected Outcomes  Short Term: Participant verbalizes understanding of the signs/symptoms and immediate care of hyper/hypoglycemia, proper foot care and importance of medication, aerobic/resistive exercise and nutrition plan for blood glucose control.;Long Term: Attainment of HbA1C < 7%.    Heart Failure  Yes    Intervention  Provide a combined exercise and nutrition program that is supplemented with education, support and counseling about heart failure. Directed toward relieving symptoms such as shortness of breath, decreased exercise tolerance,  and extremity edema.    Expected Outcomes  Improve functional capacity of life;Short term: Attendance in program 2-3 days a week with increased exercise capacity. Reported lower sodium intake. Reported increased fruit and vegetable intake. Reports medication compliance.;Short term: Daily weights obtained and reported for increase. Utilizing diuretic protocols set by physician.;Long term: Adoption of self-care skills and reduction of barriers for early signs and symptoms recognition and intervention leading to self-care maintenance.    Hypertension  Yes    Intervention  Provide education on lifestyle modifcations including regular physical activity/exercise, weight management, moderate sodium restriction and increased consumption of fresh fruit, vegetables, and low fat dairy, alcohol moderation, and smoking cessation.;Monitor prescription use compliance.    Expected Outcomes  Short Term: Continued assessment and intervention until BP is < 140/965mHG in hypertensive participants. < 130/8075mG in hypertensive participants with diabetes, heart failure or chronic kidney disease.;Long Term: Maintenance of blood pressure at goal levels.    Lipids  Yes    Intervention  Provide education and support for participant on nutrition & aerobic/resistive exercise along with prescribed medications to achieve LDL <34m4mDL >40mg76m Expected Outcomes  Short Term: Participant states understanding of desired cholesterol values and is compliant with medications prescribed. Participant is following exercise prescription and nutrition guidelines.;Long Term: Cholesterol controlled with medications as prescribed, with individualized exercise RX and with personalized nutrition plan. Value goals: LDL < 34mg,53m > 40 mg.       Core Components/Risk Factors/Patient Goals Review:    Core Components/Risk Factors/Patient Goals at Discharge (Final Review):    ITP Comments: ITP Comments    Row Name 01/03/18 1344            ITP Comments  Medical review completed today. ITP sent to Dr Loleta Chance for review, changes as needed and signature.  Documentation can be found in [facility] [place] 12/28/2017          Comments: Initial ITP

## 2018-01-11 DIAGNOSIS — I2510 Atherosclerotic heart disease of native coronary artery without angina pectoris: Secondary | ICD-10-CM | POA: Diagnosis not present

## 2018-01-11 DIAGNOSIS — I2583 Coronary atherosclerosis due to lipid rich plaque: Secondary | ICD-10-CM | POA: Diagnosis not present

## 2018-01-11 DIAGNOSIS — E119 Type 2 diabetes mellitus without complications: Secondary | ICD-10-CM | POA: Diagnosis not present

## 2018-01-11 DIAGNOSIS — I110 Hypertensive heart disease with heart failure: Secondary | ICD-10-CM | POA: Diagnosis not present

## 2018-01-11 DIAGNOSIS — I252 Old myocardial infarction: Secondary | ICD-10-CM | POA: Diagnosis not present

## 2018-01-11 DIAGNOSIS — Z951 Presence of aortocoronary bypass graft: Secondary | ICD-10-CM

## 2018-01-11 DIAGNOSIS — I5032 Chronic diastolic (congestive) heart failure: Secondary | ICD-10-CM | POA: Diagnosis not present

## 2018-01-11 DIAGNOSIS — E785 Hyperlipidemia, unspecified: Secondary | ICD-10-CM | POA: Diagnosis not present

## 2018-01-11 LAB — GLUCOSE, CAPILLARY: GLUCOSE-CAPILLARY: 115 mg/dL — AB (ref 70–99)

## 2018-01-11 NOTE — Progress Notes (Signed)
Daily Session Note  Patient Details  Name: Melanie Wolfe MRN: [id number] Date of Birth: 1946/05/18 Referring Provider:     Cardiac Rehab from 01/03/2018 in [facility] Cardiac and Pulmonary Rehab  Referring Provider  Cloretta Ned MD      Encounter Date: 01/11/2018  Check In: Session Check In - 01/11/18 0919      Check-In   Location  [facility]-Cardiac & Pulmonary Rehab    Staff Present  Justin Mend RCP,RRT,BSRT;Heath Lark, RN, BSN, CCRP;Mandi Ballard, BS, [facility]    Supervising physician immediately available to respond to emergencies  See telemetry face sheet for immediately available ER MD    Medication changes reported      No    Fall or balance concerns reported     No    Warm-up and Cool-down  Performed on first and last piece of equipment    Resistance Training Performed  Yes    VAD Patient?  No      Pain Assessment   Currently in Pain?  No/denies          Social History   Tobacco Use  Smoking Status Never Smoker  Smokeless Tobacco Never Used    Goals Met:  Exercise tolerated well Personal goals reviewed No report of cardiac concerns or symptoms Strength training completed today  Goals Unmet:  Not Applicable  Comments: First full day of exercise!  Patient was oriented to gym and equipment including functions, settings, policies, and procedures.  Patient's individual exercise prescription and treatment plan were reviewed.  All starting workloads were established based on the results of the 6 minute walk test done at initial orientation visit.  The plan for exercise progression was also introduced and progression will be customized based on patient's performance and goals.Pt able to follow exercise prescription today without complaint.  Will continue to monitor for progression.   Dr. Emily Filbert is Medical Director for [facility].

## 2018-01-18 ENCOUNTER — Encounter: Payer: Medicare Other | Admitting: *Deleted

## 2018-01-18 DIAGNOSIS — Z951 Presence of aortocoronary bypass graft: Secondary | ICD-10-CM

## 2018-01-18 LAB — GLUCOSE, CAPILLARY
Glucose-Capillary: 180 mg/dL — ABNORMAL HIGH (ref 70–99)
Glucose-Capillary: 127 mg/dL — ABNORMAL HIGH (ref 70–99)

## 2018-01-18 NOTE — Progress Notes (Signed)
Daily Session Note  Patient Details  Name: Melanie Wolfe MRN: [id number] Date of Birth: [birth year] Referring Provider:     Cardiac Rehab from 01/03/2018 in [facility] Cardiac and Pulmonary Rehab  Referring Provider  Cloretta Ned MD      Encounter Date: 01/18/2018  Check In: Session Check In - 01/18/18 0935      Check-In   Location  [facility]-Cardiac & Pulmonary Rehab    Staff Present  Heath Lark, RN, BSN, CCRP;Abbie Jablon Luan Pulling, MA, RCEP, CCRP, Exercise Physiologist;Mandi Zachery Conch, BS, [facility]    Supervising physician immediately available to respond to emergencies  See telemetry face sheet for immediately available ER MD    Medication changes reported      No    Fall or balance concerns reported     No    Warm-up and Cool-down  Performed on first and last piece of equipment    Resistance Training Performed  Yes    VAD Patient?  No    PAD/SET Patient?  No      Pain Assessment   Currently in Pain?  No/denies          Social History   Tobacco Use  Smoking Status Never Smoker  Smokeless Tobacco Never Used    Goals Met:  Exercise tolerated well No report of cardiac concerns or symptoms Strength training completed today  Goals Unmet:  Not Applicable  Comments: Pt able to follow exercise prescription today without complaint.  Will continue to monitor for progression.    Dr. Emily Filbert is Medical Director for [facility].

## 2018-01-19 ENCOUNTER — Encounter: Payer: Self-pay | Admitting: *Deleted

## 2018-01-19 DIAGNOSIS — Z951 Presence of aortocoronary bypass graft: Secondary | ICD-10-CM

## 2018-01-19 NOTE — Progress Notes (Signed)
Cardiac Individual Treatment Plan  Patient Details  Name: Melanie Wolfe MRN: [id number] Date of Birth: 1946-03-03 Referring Provider:     Cardiac Rehab from 01/03/2018 in [facility] Cardiac and Pulmonary Rehab  Referring Provider  Cloretta Ned MD      Initial Encounter Date:    Cardiac Rehab from 01/03/2018 in [facility] Cardiac and Pulmonary Rehab  Date  01/03/18      Visit Diagnosis: S/P CABG x 4  Patient's Home Medications on Admission:  Current Outpatient Medications:  .  acetaminophen (TYLENOL) 325 MG tablet, Take 650 mg by mouth every 6 (six) hours as needed., Disp: , Rfl:  .  apixaban (ELIQUIS) 5 MG TABS tablet, Take 5 mg by mouth every 12 (twelve) hours., Disp: , Rfl:  .  aspirin EC 81 MG tablet, Take 81 mg by mouth daily., Disp: , Rfl:  .  atorvastatin (LIPITOR) 40 MG tablet, Take 40 mg by mouth daily., Disp: , Rfl:  .  ezetimibe (ZETIA) 10 MG tablet, Take by mouth., Disp: , Rfl:  .  lisinopril (PRINIVIL,ZESTRIL) 2.5 MG tablet, Take 2.5 mg by mouth daily., Disp: , Rfl:  .  metFORMIN (GLUCOPHAGE) 500 MG tablet, Take 500 mg by mouth 2 (two) times daily with a meal., Disp: , Rfl:  .  metoprolol succinate (TOPROL-XL) 50 MG 24 hr tablet, Take 50 mg by mouth every 12 (twelve) hours. Take with or immediately following a meal., Disp: , Rfl:  .  pantoprazole (PROTONIX) 40 MG tablet, Take 40 mg by mouth daily., Disp: , Rfl:  .  traMADol (ULTRAM) 50 MG tablet, Take 0.5 tablets (25 mg total) by mouth every 6 (six) hours as needed. 1/2 tab (Patient not taking: Reported on 01/03/2018), Disp: 30 tablet, Rfl: 0  Past Medical History: Past Medical History:  Diagnosis Date  . Chronic diastolic heart failure ([place])   . Coronary artery disease due to lipid rich plaque   . DM II (diabetes mellitus, type II), controlled ([place])   . HLD (hyperlipidemia)   . HTN (hypertension)   . NSTEMI (non-ST elevated myocardial infarction) ([place])   . STEMI (ST elevation myocardial infarction) ([place]) 2004   s/p PCI  to LAD    Tobacco Use: Social History   Tobacco Use  Smoking Status Never Smoker  Smokeless Tobacco Never Used    Labs: Recent Review Flowsheet Data    There is no flowsheet data to display.       Exercise Target Goals:    Exercise Program Goal: Individual exercise prescription set using results from initial 6 min walk test and THRR while considering  patient's activity barriers and safety.   Exercise Prescription Goal: Initial exercise prescription builds to 30-45 minutes a day of aerobic activity, 2-3 days per week.  Home exercise guidelines will be given to patient during program as part of exercise prescription that the participant will acknowledge.  Activity Barriers & Risk Stratification: Activity Barriers & Cardiac Risk Stratification - 01/03/18 1401      Activity Barriers & Cardiac Risk Stratification   Activity Barriers  Deconditioning;Muscular Weakness    Cardiac Risk Stratification  High History of MI       6 Minute Walk: 6 Minute Walk    Row Name 01/03/18 1524         6 Minute Walk   Phase  Initial     Distance  1245 feet     Walk Time  6 minutes     # of Rest Breaks  0  MPH  2.36     METS  2.97     RPE  11     VO2 Peak  10.41     Symptoms  No     Resting HR  73 bpm     Resting BP  126/64     Resting Oxygen Saturation   98 %     Exercise Oxygen Saturation  during 6 min walk  96 %     Max Ex. HR  93 bpm     Max Ex. BP  146/66     2 Minute Post BP  124/62        Oxygen Initial Assessment:   Oxygen Re-Evaluation:   Oxygen Discharge (Final Oxygen Re-Evaluation):   Initial Exercise Prescription: Initial Exercise Prescription - 01/03/18 1500      Date of Initial Exercise RX and Referring Provider   Date  01/03/18    Referring Provider  Cloretta Ned MD      Treadmill   MPH  2.3    Grade  0.5    Minutes  15    METs  2.92      NuStep   Level  1    SPM  80    Minutes  15    METs  2.5      Arm Ergometer   Level  2     Watts  33    RPM  25    Minutes  15    METs  2.5      Prescription Details   Frequency (times per week)  2    Duration  Progress to 30 minutes of continuous aerobic without signs/symptoms of physical distress      Intensity   THRR 40-80% of Max Heartrate  104-135    Ratings of Perceived Exertion  11-13    Perceived Dyspnea  0-4      Progression   Progression  Continue to progress workloads to maintain intensity without signs/symptoms of physical distress.      Resistance Training   Training Prescription  Yes    Weight  3 lbs       Perform Capillary Blood Glucose checks as needed.  Exercise Prescription Changes: Exercise Prescription Changes    Row Name 01/03/18 1400             Response to Exercise   Blood Pressure (Admit)  126/64       Blood Pressure (Exercise)  146/66       Blood Pressure (Exit)  124/62       Heart Rate (Admit)  73 bpm       Heart Rate (Exercise)  98 bpm       Heart Rate (Exit)  80 bpm       Oxygen Saturation (Admit)  98 %       Oxygen Saturation (Exercise)  96 %       Rating of Perceived Exertion (Exercise)  11       Symptoms  none       Comments  walk test results          Exercise Comments: Exercise Comments    Row Name 01/11/18 1021           Exercise Comments  First full day of exercise!  Patient was oriented to gym and equipment including functions, settings, policies, and procedures.  Patient's individual exercise prescription and treatment plan were reviewed.  All starting workloads were established based on the results of  the 6 minute walk test done at initial orientation visit.  The plan for exercise progression was also introduced and progression will be customized based on patient's performance and goals.          Exercise Goals and Review: Exercise Goals    Row Name 01/03/18 1530             Exercise Goals   Increase Physical Activity  Yes       Intervention  Provide advice, education, support and counseling about  physical activity/exercise needs.;Develop an individualized exercise prescription for aerobic and resistive training based on initial evaluation findings, risk stratification, comorbidities and participant's personal goals.       Expected Outcomes  Short Term: Attend rehab on a regular basis to increase amount of physical activity.;Long Term: Add in home exercise to make exercise part of routine and to increase amount of physical activity.;Long Term: Exercising regularly at least 3-5 days a week.       Increase Strength and Stamina  Yes       Intervention  Develop an individualized exercise prescription for aerobic and resistive training based on initial evaluation findings, risk stratification, comorbidities and participant's personal goals.;Provide advice, education, support and counseling about physical activity/exercise needs.       Expected Outcomes  Short Term: Increase workloads from initial exercise prescription for resistance, speed, and METs.;Short Term: Perform resistance training exercises routinely during rehab and add in resistance training at home;Long Term: Improve cardiorespiratory fitness, muscular endurance and strength as measured by increased METs and functional capacity (6MWT)       Able to understand and use rate of perceived exertion (RPE) scale  Yes       Intervention  Provide education and explanation on how to use RPE scale       Expected Outcomes  Short Term: Able to use RPE daily in rehab to express subjective intensity level;Long Term:  Able to use RPE to guide intensity level when exercising independently       Knowledge and understanding of Target Heart Rate Range (THRR)  Yes       Intervention  Provide education and explanation of THRR including how the numbers were predicted and where they are located for reference       Expected Outcomes  Short Term: Able to use daily as guideline for intensity in rehab;Short Term: Able to state/look up THRR;Long Term: Able to use THRR to  govern intensity when exercising independently       Able to check pulse independently  Yes       Intervention  Provide education and demonstration on how to check pulse in carotid and radial arteries.;Review the importance of being able to check your own pulse for safety during independent exercise       Expected Outcomes  Short Term: Able to explain why pulse checking is important during independent exercise;Long Term: Able to check pulse independently and accurately       Understanding of Exercise Prescription  Yes       Intervention  Provide education, explanation, and written materials on patient's individual exercise prescription       Expected Outcomes  Short Term: Able to explain program exercise prescription;Long Term: Able to explain home exercise prescription to exercise independently          Exercise Goals Re-Evaluation : Exercise Goals Re-Evaluation    [place] Name 01/11/18 1022             Exercise Goal Re-Evaluation  Exercise Goals Review  Understanding of Exercise Prescription;Knowledge and understanding of Target Heart Rate Range (THRR);Able to understand and use rate of perceived exertion (RPE) scale       Comments  Reviewed RPE scale, THR and program prescription with pt today.  Pt voiced understanding and was given a copy of goals to take home.        Expected Outcomes  Short: Use RPE daily to regulate intensity. Long: Follow program prescription in THR.          Discharge Exercise Prescription (Final Exercise Prescription Changes): Exercise Prescription Changes - 01/03/18 1400      Response to Exercise   Blood Pressure (Admit)  126/64    Blood Pressure (Exercise)  146/66    Blood Pressure (Exit)  124/62    Heart Rate (Admit)  73 bpm    Heart Rate (Exercise)  98 bpm    Heart Rate (Exit)  80 bpm    Oxygen Saturation (Admit)  98 %    Oxygen Saturation (Exercise)  96 %    Rating of Perceived Exertion (Exercise)  11    Symptoms  none    Comments  walk test results        Nutrition:  Target Goals: Understanding of nutrition guidelines, daily intake of sodium <1552m, cholesterol <2051m calories 30% from fat and 7% or less from saturated fats, daily to have 5 or more servings of fruits and vegetables.  Biometrics: Pre Biometrics - 01/03/18 1530      Pre Biometrics   Height  5' 3.4" (1.61 m)    Weight  140 lb 9.6 oz (63.8 kg)    Waist Circumference  32.25 inches    Hip Circumference  38.75 inches    Waist to Hip Ratio  0.83 %    BMI (Calculated)  24.6    Single Leg Stand  30 seconds        Nutrition Therapy Plan and Nutrition Goals: Nutrition Therapy & Goals - 01/03/18 1403      Intervention Plan   Intervention  Prescribe, educate and counsel regarding individualized specific dietary modifications aiming towards targeted core components such as weight, hypertension, lipid management, diabetes, heart failure and other comorbidities.    Expected Outcomes  Short Term Goal: Understand basic principles of dietary content, such as calories, fat, sodium, cholesterol and nutrients.;Short Term Goal: A plan has been developed with personal nutrition goals set during dietitian appointment.;Long Term Goal: Adherence to prescribed nutrition plan.       Nutrition Assessments: Nutrition Assessments - 01/03/18 1055      MEDFICTS Scores   Pre Score  7       Nutrition Goals Re-Evaluation:   Nutrition Goals Discharge (Final Nutrition Goals Re-Evaluation):   Psychosocial: Target Goals: Acknowledge presence or absence of significant depression and/or stress, maximize coping skills, provide positive support system. Participant is able to verbalize types and ability to use techniques and skills needed for reducing stress and depression.   Initial Review & Psychosocial Screening: Initial Psych Review & Screening - 01/03/18 1403      Initial Review   Current issues with  None Identified      Family Dynamics   Good Support System?  Yes Children       Barriers   Psychosocial barriers to participate in program  There are no identifiable barriers or psychosocial needs.;The patient should benefit from training in stress management and relaxation.      Screening Interventions   Interventions  Encouraged to  exercise;Provide feedback about the scores to participant;To provide support and resources with identified psychosocial needs    Expected Outcomes  Short Term goal: Utilizing psychosocial counselor, staff and physician to assist with identification of specific Stressors or current issues interfering with healing process. Setting desired goal for each stressor or current issue identified.;Long Term Goal: Stressors or current issues are controlled or eliminated.;Short Term goal: Identification and review with participant of any Quality of Life or Depression concerns found by scoring the questionnaire.;Long Term goal: The participant improves quality of Life and PHQ9 Scores as seen by post scores and/or verbalization of changes       Quality of Life Scores:  Quality of Life - 01/03/18 1056      Quality of Life   Select  Quality of Life      Quality of Life Scores   Health/Function Pre  19.23 %    Socioeconomic Pre  17.63 %    Psych/Spiritual Pre  24.43 %    Family Pre  25.3 %    GLOBAL Pre  20.77 %      Scores of 19 and below usually indicate a poorer quality of life in these areas.  A difference of  2-3 points is a clinically meaningful difference.  A difference of 2-3 points in the total score of the Quality of Life Index has been associated with significant improvement in overall quality of life, self-image, physical symptoms, and general health in studies assessing change in quality of life.  PHQ-9: Recent Review Flowsheet Data    Depression screen [facility] 2/9 01/03/2018   Decreased Interest 0   Down, Depressed, Hopeless 0   PHQ - 2 Score 0   Altered sleeping 1   Tired, decreased energy 0   Change in appetite 0   Feeling bad or  failure about yourself  0   Trouble concentrating 0   Moving slowly or fidgety/restless 0   Suicidal thoughts 0   PHQ-9 Score 1   Difficult doing work/chores Not difficult at all     Interpretation of Total Score  Total Score Depression Severity:  1-4 = Minimal depression, 5-9 = Mild depression, 10-14 = Moderate depression, 15-19 = Moderately severe depression, 20-27 = Severe depression   Psychosocial Evaluation and Intervention:   Psychosocial Re-Evaluation:   Psychosocial Discharge (Final Psychosocial Re-Evaluation):   Vocational Rehabilitation: Provide vocational rehab assistance to qualifying candidates.   Vocational Rehab Evaluation & Intervention: Vocational Rehab - 01/03/18 1411      Initial Vocational Rehab Evaluation & Intervention   Assessment shows need for Vocational Rehabilitation  No       Education: Education Goals: Education classes will be provided on a variety of topics geared toward better understanding of heart health and risk factor modification. Participant will state understanding/return demonstration of topics presented as noted by education test scores.  Learning Barriers/Preferences: Learning Barriers/Preferences - 01/03/18 1411      Learning Barriers/Preferences   Learning Barriers  None    Learning Preferences  None       Education Topics:  AED/CPR: - Group verbal and written instruction with the use of models to demonstrate the basic use of the AED with the basic ABC's of resuscitation.   General Nutrition Guidelines/Fats and Fiber: -Group instruction provided by verbal, written material, models and posters to present the general guidelines for heart healthy nutrition. Gives an explanation and review of dietary fats and fiber.   Cardiac Rehab from 01/18/2018 in [facility] Cardiac and Pulmonary Rehab  Date  01/18/18  Educator  CR  Instruction Review Code  1- Verbalizes Understanding      Controlling Sodium/Reading Food Labels: -Group  verbal and written material supporting the discussion of sodium use in heart healthy nutrition. Review and explanation with models, verbal and written materials for utilization of the food label.   Exercise Physiology & General Exercise Guidelines: - Group verbal and written instruction with models to review the exercise physiology of the cardiovascular system and associated critical values. Provides general exercise guidelines with specific guidelines to those with heart or lung disease.    Aerobic Exercise & Resistance Training: - Gives group verbal and written instruction on the various components of exercise. Focuses on aerobic and resistive training programs and the benefits of this training and how to safely progress through these programs..   Flexibility, Balance, Mind/Body Relaxation: Provides group verbal/written instruction on the benefits of flexibility and balance training, including mind/body exercise modes such as yoga, pilates and tai chi.  Demonstration and skill practice provided.   Stress and Anxiety: - Provides group verbal and written instruction about the health risks of elevated stress and causes of high stress.  Discuss the correlation between heart/lung disease and anxiety and treatment options. Review healthy ways to manage with stress and anxiety.   Cardiac Rehab from 01/18/2018 in [facility] Cardiac and Pulmonary Rehab  Date  01/11/18  Educator  [facility]  Instruction Review Code  1- Verbalizes Understanding      Depression: - Provides group verbal and written instruction on the correlation between heart/lung disease and depressed mood, treatment options, and the stigmas associated with seeking treatment.   Anatomy & Physiology of the Heart: - Group verbal and written instruction and models provide basic cardiac anatomy and physiology, with the coronary electrical and arterial systems. Review of Valvular disease and Heart Failure   Cardiac Procedures: - Group verbal and  written instruction to review commonly prescribed medications for heart disease. Reviews the medication, class of the drug, and side effects. Includes the steps to properly store meds and maintain the prescription regimen. (beta blockers and nitrates)   Cardiac Medications I: - Group verbal and written instruction to review commonly prescribed medications for heart disease. Reviews the medication, class of the drug, and side effects. Includes the steps to properly store meds and maintain the prescription regimen.   Cardiac Medications II: -Group verbal and written instruction to review commonly prescribed medications for heart disease. Reviews the medication, class of the drug, and side effects. (all other drug classes)    Go Sex-Intimacy & Heart Disease, Get SMART - Goal Setting: - Group verbal and written instruction through game format to discuss heart disease and the return to sexual intimacy. Provides group verbal and written material to discuss and apply goal setting through the application of the S.M.A.R.T. Method.   Other Matters of the Heart: - Provides group verbal, written materials and models to describe Stable Angina and Peripheral Artery. Includes description of the disease process and treatment options available to the cardiac patient.   Exercise & Equipment Safety: - Individual verbal instruction and demonstration of equipment use and safety with use of the equipment.   Cardiac Rehab from 01/18/2018 in [facility] Cardiac and Pulmonary Rehab  Date  01/03/18  Educator  [facility]  Instruction Review Code  1- Verbalizes Understanding      Infection Prevention: - Provides verbal and written material to individual with discussion of infection control including proper hand washing and proper equipment cleaning during exercise  session.   Cardiac Rehab from 01/18/2018 in [facility] Cardiac and Pulmonary Rehab  Date  01/03/18  Educator  [facility]  Instruction Review Code  1- Verbalizes Understanding       Falls Prevention: - Provides verbal and written material to individual with discussion of falls prevention and safety.   Cardiac Rehab from 01/18/2018 in [facility] Cardiac and Pulmonary Rehab  Date  01/03/18  Educator  [facility]  Instruction Review Code  1- Verbalizes Understanding      Diabetes: - Individual verbal and written instruction to review signs/symptoms of diabetes, desired ranges of glucose level fasting, after meals and with exercise. Acknowledge that pre and post exercise glucose checks will be done for 3 sessions at entry of program.   Cardiac Rehab from 01/18/2018 in [facility] Cardiac and Pulmonary Rehab  Date  01/03/18  Educator  [facility]  Instruction Review Code  1- Verbalizes Understanding      Know Your Numbers and Risk Factors: -Group verbal and written instruction about important numbers in your health.  Discussion of what are risk factors and how they play a role in the disease process.  Review of Cholesterol, Blood Pressure, Diabetes, and BMI and the role they play in your overall health.   Sleep Hygiene: -Provides group verbal and written instruction about how sleep can affect your health.  Define sleep hygiene, discuss sleep cycles and impact of sleep habits. Review good sleep hygiene tips.    Other: -Provides group and verbal instruction on various topics (see comments)   Knowledge Questionnaire Score: Knowledge Questionnaire Score - 01/03/18 1055      Knowledge Questionnaire Score   Pre Score  22/26 test reviewed with pt today  Education [place]: Angina, Nutrition       Core Components/Risk Factors/Patient Goals at Admission: Personal Goals and Risk Factors at Admission - 01/03/18 1403      Core Components/Risk Factors/Patient Goals on Admission    Weight Management  Yes;Weight Maintenance    Intervention  Weight Management: Develop a combined nutrition and exercise program designed to reach desired caloric intake, while maintaining appropriate intake of nutrient  and fiber, sodium and fats, and appropriate energy expenditure required for the weight goal.;Weight Management: Provide education and appropriate resources to help participant work on and attain dietary goals.    Admit Weight  140 lb 9.6 oz (63.8 kg)    Expected Outcomes  Weight Maintenance: Understanding of the daily nutrition guidelines, which includes 25-35% calories from fat, 7% or less cal from saturated fats, less than 213m cholesterol, less than 1.5gm of sodium, & 5 or more servings of fruits and vegetables daily    Diabetes  Yes    Intervention  Provide education about signs/symptoms and action to take for hypo/hyperglycemia.;Provide education about proper nutrition, including hydration, and aerobic/resistive exercise prescription along with prescribed medications to achieve blood glucose in normal ranges: Fasting glucose 65-99 mg/dL    Expected Outcomes  Short Term: Participant verbalizes understanding of the signs/symptoms and immediate care of hyper/hypoglycemia, proper foot care and importance of medication, aerobic/resistive exercise and nutrition plan for blood glucose control.;Long Term: Attainment of HbA1C < 7%.    Heart Failure  Yes    Intervention  Provide a combined exercise and nutrition program that is supplemented with education, support and counseling about heart failure. Directed toward relieving symptoms such as shortness of breath, decreased exercise tolerance, and extremity edema.    Expected Outcomes  Improve functional capacity of life;Short term: Attendance in program 2-3 days a week  with increased exercise capacity. Reported lower sodium intake. Reported increased fruit and vegetable intake. Reports medication compliance.;Short term: Daily weights obtained and reported for increase. Utilizing diuretic protocols set by physician.;Long term: Adoption of self-care skills and reduction of barriers for early signs and symptoms recognition and intervention leading to self-care  maintenance.    Hypertension  Yes    Intervention  Provide education on lifestyle modifcations including regular physical activity/exercise, weight management, moderate sodium restriction and increased consumption of fresh fruit, vegetables, and low fat dairy, alcohol moderation, and smoking cessation.;Monitor prescription use compliance.    Expected Outcomes  Short Term: Continued assessment and intervention until BP is < 140/25m HG in hypertensive participants. < 130/827mHG in hypertensive participants with diabetes, heart failure or chronic kidney disease.;Long Term: Maintenance of blood pressure at goal levels.    Lipids  Yes    Intervention  Provide education and support for participant on nutrition & aerobic/resistive exercise along with prescribed medications to achieve LDL <7075mHDL >1m7m  Expected Outcomes  Short Term: Participant states understanding of desired cholesterol values and is compliant with medications prescribed. Participant is following exercise prescription and nutrition guidelines.;Long Term: Cholesterol controlled with medications as prescribed, with individualized exercise RX and with personalized nutrition plan. Value goals: LDL < 70mg44mL > 40 mg.       Core Components/Risk Factors/Patient Goals Review:    Core Components/Risk Factors/Patient Goals at Discharge (Final Review):    ITP Comments: ITP Comments    Row Name 01/03/18 1344 01/19/18 0554         ITP Comments  Medical review completed today. ITP sent to Dr M MilLoleta Chancereview, changes as needed and signature.  Documentation can be found in [facility]/2019  30 day review. Continue with ITP unless directed changes per Medical Director review.   New to program         Comments: 30 day review. Continue with ITP unless directed changes per Medical Director review.

## 2018-01-25 DIAGNOSIS — Z951 Presence of aortocoronary bypass graft: Secondary | ICD-10-CM | POA: Diagnosis not present

## 2018-01-25 LAB — GLUCOSE, CAPILLARY
GLUCOSE-CAPILLARY: 129 mg/dL — AB (ref 70–99)
Glucose-Capillary: 160 mg/dL — ABNORMAL HIGH (ref 70–99)

## 2018-01-25 NOTE — Progress Notes (Signed)
Daily Session Note  Patient Details  Name: Melanie Wolfe MRN: [id number] Date of Birth: 17-Nov-1945 Referring Provider:     Cardiac Rehab from 01/03/2018 in [facility] Cardiac and Pulmonary Rehab  Referring Provider  Cloretta Ned MD      Encounter Date: 01/25/2018  Check In: Session Check In - 01/25/18 1051      Check-In   Location  [facility]-Cardiac & Pulmonary Rehab    Staff Present  Heath Lark, RN, BSN, CCRP;Mandi Inell Mimbs, BS, PEC;Nada Maclachlan, BA, ACSM CEP, Exercise Physiologist    Supervising physician immediately available to respond to emergencies  See telemetry face sheet for immediately available ER MD    Medication changes reported      No    Fall or balance concerns reported     No    Warm-up and Cool-down  Performed on first and last piece of equipment    Resistance Training Performed  Yes    VAD Patient?  No    PAD/SET Patient?  No      Pain Assessment   Currently in Pain?  No/denies    Multiple Pain Sites  No          Social History   Tobacco Use  Smoking Status Never Smoker  Smokeless Tobacco Never Used    Goals Met:  Independence with exercise equipment Exercise tolerated well No report of cardiac concerns or symptoms Strength training completed today  Goals Unmet:  Not Applicable  Comments: Pt able to follow exercise prescription today without complaint.  Will continue to monitor for progression.    Dr. Emily Filbert is Medical Director for [place] and [facility].

## 2018-01-27 DIAGNOSIS — Z951 Presence of aortocoronary bypass graft: Secondary | ICD-10-CM | POA: Diagnosis not present

## 2018-01-27 NOTE — Progress Notes (Signed)
Daily Session Note  Patient Details  Name: ROGENA DEUPREE MRN: [id number] Date of Birth: 03-Jul-1946 Referring Provider:     Cardiac Rehab from 01/03/2018 in [facility] Cardiac and Pulmonary Rehab  Referring Provider  Cloretta Ned MD      Encounter Date: 01/27/2018  Check In: Session Check In - 01/27/18 0933      Check-In   Location  [facility]Cardiac & Pulmonary Rehab    Staff Present  Justin Mend RCP,RRT,BSRT;Carroll Enterkin, RN, BSN;Mandi Ballard, BS, [facility]    Supervising physician immediately available to respond to emergencies  See telemetry face sheet for immediately available ER MD    Medication changes reported      No    Fall or balance concerns reported     No    Warm-up and Cool-down  Performed on first and last piece of equipment    Resistance Training Performed  Yes    VAD Patient?  No      Pain Assessment   Currently in Pain?  No/denies        Exercise Prescription Changes - 01/26/18 1500      Response to Exercise   Blood Pressure (Admit)  106/68    Blood Pressure (Exercise)  132/58    Blood Pressure (Exit)  122/64    Heart Rate (Admit)  75 bpm    Heart Rate (Exercise)  85 bpm    Heart Rate (Exit)  71 bpm    Rating of Perceived Exertion (Exercise)  14    Symptoms  none    Comments  second full day of exercise    Duration  Continue with 30 min of aerobic exercise without signs/symptoms of physical distress.    Intensity  THRR unchanged      Progression   Progression  Continue to progress workloads to maintain intensity without signs/symptoms of physical distress.    Average METs  1.96      Resistance Training   Training Prescription  Yes    Weight  3 lbs    Reps  10-15      Interval Training   Interval Training  No      Treadmill   MPH  2.2    Grade  0    Minutes  15    METs  2.68      NuStep   Level  1    Minutes  15    METs  1.9      Arm Ergometer   Level  2    Minutes  15    METs  1.9       Social History   Tobacco Use  Smoking  Status Never Smoker  Smokeless Tobacco Never Used    Goals Met:  Independence with exercise equipment Exercise tolerated well No report of cardiac concerns or symptoms Strength training completed today  Goals Unmet:  Not Applicable  Comments: Pt able to follow exercise prescription today without complaint.  Will continue to monitor for progression.   Dr. Emily Filbert is Medical Director for [place] and [facility].

## 2018-02-01 ENCOUNTER — Encounter: Payer: Medicare Other | Admitting: *Deleted

## 2018-02-01 DIAGNOSIS — Z951 Presence of aortocoronary bypass graft: Secondary | ICD-10-CM

## 2018-02-01 NOTE — Progress Notes (Signed)
Daily Session Note  Patient Details  Name: Melanie Wolfe MRN: [id number] Date of Birth: 13-Sep-1945 Referring Provider:     Cardiac Rehab from 01/03/2018 in [facility] Cardiac and Pulmonary Rehab  Referring Provider  Cloretta Ned MD      Encounter Date: 02/01/2018  Check In: Session Check In - 02/01/18 1150      Check-In   Supervising physician immediately available to respond to emergencies  See telemetry face sheet for immediately available ER MD    Location  [facility]Cardiac & Pulmonary Rehab    Staff Present  Alberteen Sam, MA, RCEP, CCRP, Exercise Physiologist;Susanne Bice, RN, BSN, CCRP;Joseph Hood RCP,RRT,BSRT    Medication changes reported      No    Fall or balance concerns reported     No    Warm-up and Cool-down  Performed on first and last piece of equipment    Resistance Training Performed  Yes    VAD Patient?  No    PAD/SET Patient?  No      Pain Assessment   Currently in Pain?  No/denies          Social History   Tobacco Use  Smoking Status Never Smoker  Smokeless Tobacco Never Used    Goals Met:  Independence with exercise equipment Exercise tolerated well No report of cardiac concerns or symptoms Strength training completed today  Goals Unmet:  Not Applicable  Comments: Pt able to follow exercise prescription today without complaint.  Will continue to monitor for progression.     Dr. Emily Filbert is Medical Director for [place] and [facility].

## 2018-02-03 ENCOUNTER — Encounter: Payer: Medicare Other | Attending: Cardiovascular Disease

## 2018-02-03 DIAGNOSIS — I5032 Chronic diastolic (congestive) heart failure: Secondary | ICD-10-CM | POA: Diagnosis not present

## 2018-02-03 DIAGNOSIS — E119 Type 2 diabetes mellitus without complications: Secondary | ICD-10-CM | POA: Diagnosis not present

## 2018-02-03 DIAGNOSIS — E785 Hyperlipidemia, unspecified: Secondary | ICD-10-CM | POA: Diagnosis not present

## 2018-02-03 DIAGNOSIS — I2583 Coronary atherosclerosis due to lipid rich plaque: Secondary | ICD-10-CM | POA: Insufficient documentation

## 2018-02-03 DIAGNOSIS — Z951 Presence of aortocoronary bypass graft: Secondary | ICD-10-CM

## 2018-02-03 DIAGNOSIS — I110 Hypertensive heart disease with heart failure: Secondary | ICD-10-CM | POA: Insufficient documentation

## 2018-02-03 DIAGNOSIS — I2510 Atherosclerotic heart disease of native coronary artery without angina pectoris: Secondary | ICD-10-CM | POA: Insufficient documentation

## 2018-02-03 DIAGNOSIS — I252 Old myocardial infarction: Secondary | ICD-10-CM | POA: Insufficient documentation

## 2018-02-03 NOTE — Progress Notes (Signed)
Daily Session Note  Patient Details  Name: LAKEYIA SURBER MRN: [id number] Date of Birth: [birth year] Referring Provider:     Cardiac Rehab from 01/03/2018 in [facility] Cardiac and Pulmonary Rehab  Referring Provider  Cloretta Ned MD      Encounter Date: 02/03/2018  Check In: Session Check In - 02/03/18 0916      Check-In   Supervising physician immediately available to respond to emergencies  See telemetry face sheet for immediately available ER MD    Location  [facility]Cardiac & Pulmonary Rehab    Staff Present  Justin Mend RCP,RRT,BSRT;Carroll Enterkin, RN, Levie Heritage, MA, RCEP, CCRP, Exercise Physiologist    Medication changes reported      No    Fall or balance concerns reported     No    Warm-up and Cool-down  Performed on first and last piece of equipment    Resistance Training Performed  Yes    VAD Patient?  No      Pain Assessment   Currently in Pain?  No/denies          Social History   Tobacco Use  Smoking Status Never Smoker  Smokeless Tobacco Never Used    Goals Met:  Independence with exercise equipment Exercise tolerated well No report of cardiac concerns or symptoms Strength training completed today  Goals Unmet:  Not Applicable  Comments: Pt able to follow exercise prescription today without complaint.  Will continue to monitor for progression.Reviewed home exercise with pt today.  Pt plans to continue to walk at home and return to the [facility] (once cleared to drive) for exercise.  Reviewed THR, pulse, RPE, sign and symptoms, and when to call 911 or MD.  Also discussed weather considerations and indoor options.  Pt voiced understanding.   Dr. Emily Filbert is Medical Director for [place] and [facility].

## 2018-02-08 ENCOUNTER — Other Ambulatory Visit: Payer: Self-pay | Admitting: Medical Oncology

## 2018-02-08 DIAGNOSIS — Z13820 Encounter for screening for osteoporosis: Secondary | ICD-10-CM

## 2018-02-10 ENCOUNTER — Encounter: Payer: Medicare Other | Admitting: *Deleted

## 2018-02-10 DIAGNOSIS — Z951 Presence of aortocoronary bypass graft: Secondary | ICD-10-CM | POA: Diagnosis not present

## 2018-02-10 NOTE — Progress Notes (Signed)
Daily Session Note  Patient Details  Name: Melanie Wolfe MRN: [id number] Date of Birth: [birth year] Referring Provider:     Cardiac Rehab from 01/03/2018 in [facility] Cardiac and Pulmonary Rehab  Referring Provider  Melanie Ned MD      Encounter Date: 02/10/2018  Check In: Session Check In - 02/10/18 0929      Check-In   Supervising physician immediately available to respond to emergencies  See telemetry face sheet for immediately available ER MD    Location  [facility]-Cardiac & Pulmonary Rehab    Staff Present  Joellyn Rued, BS, PEC;Joseph Hood RCP,RRT,BSRT;Meredith Newton, [place] BSN    Medication changes reported      No    Fall or balance concerns reported     No    Warm-up and Cool-down  Performed on first and last piece of equipment    Resistance Training Performed  Yes    VAD Patient?  No    PAD/SET Patient?  No      Pain Assessment   Currently in Pain?  No/denies    Multiple Pain Sites  No          Social History   Tobacco Use  Smoking Status Never Smoker  Smokeless Tobacco Never Used    Goals Met:  Independence with exercise equipment Exercise tolerated well No report of cardiac concerns or symptoms Strength training completed today  Goals Unmet:  Not Applicable  Comments: Pt able to follow exercise prescription today without complaint.  Will continue to monitor for progression.    Dr. Emily Wolfe is Medical Director for [place] and [facility].

## 2018-02-15 ENCOUNTER — Encounter: Payer: Medicare Other | Admitting: *Deleted

## 2018-02-15 ENCOUNTER — Encounter: Payer: Self-pay | Admitting: Dietician

## 2018-02-15 DIAGNOSIS — Z951 Presence of aortocoronary bypass graft: Secondary | ICD-10-CM | POA: Diagnosis not present

## 2018-02-15 NOTE — Progress Notes (Signed)
Daily Session Note  Patient Details  Name: Melanie Wolfe MRN: [id number] Date of Birth: 11/24/1945 Referring Provider:     Cardiac Rehab from 01/03/2018 in [facility] Cardiac and Pulmonary Rehab  Referring Provider  Blazing, Michael MD      Encounter Date: 02/15/2018  Check In: Session Check In - 02/15/18 0931      Check-In   Supervising physician immediately available to respond to emergencies  See telemetry face sheet for immediately available ER MD    Location  [facility]-Cardiac & Pulmonary Rehab    Staff Present  Mandi Ballard, BS, PEC;Susanne Bice, RN, BSN, CCRP;Jessica Hawkins, MA, RCEP, CCRP, Exercise Physiologist    Medication changes reported      No    Fall or balance concerns reported     No    Warm-up and Cool-down  Performed on first and last piece of equipment    Resistance Training Performed  Yes    VAD Patient?  No    PAD/SET Patient?  No      Pain Assessment   Currently in Pain?  No/denies          Social History   Tobacco Use  Smoking Status Never Smoker  Smokeless Tobacco Never Used    Goals Met:  Independence with exercise equipment Exercise tolerated well No report of cardiac concerns or symptoms Strength training completed today  Goals Unmet:  Not Applicable  Comments: Pt able to follow exercise prescription today without complaint.  Will continue to monitor for progression.    Dr. Mark Miller is Medical Director for [facility] and [facility]. 

## 2018-02-16 ENCOUNTER — Encounter: Payer: Self-pay | Admitting: *Deleted

## 2018-02-16 DIAGNOSIS — Z951 Presence of aortocoronary bypass graft: Secondary | ICD-10-CM

## 2018-02-16 NOTE — Progress Notes (Signed)
Cardiac Individual Treatment Plan  Patient Details  Name: Melanie Wolfe MRN: [id number] Date of Birth: 1945/10/30 Referring Provider:     Cardiac Rehab from 01/03/2018 in [facility] Cardiac and Pulmonary Rehab  Referring Provider  Cloretta Ned MD      Initial Encounter Date:    Cardiac Rehab from 01/03/2018 in [facility] Cardiac and Pulmonary Rehab  Date  01/03/18      Visit Diagnosis: S/P CABG x 4  Patient's Home Medications on Admission:  Current Outpatient Medications:  .  acetaminophen (TYLENOL) 325 MG tablet, Take 650 mg by mouth every 6 (six) hours as needed., Disp: , Rfl:  .  apixaban (ELIQUIS) 5 MG TABS tablet, Take 5 mg by mouth every 12 (twelve) hours., Disp: , Rfl:  .  aspirin EC 81 MG tablet, Take 81 mg by mouth daily., Disp: , Rfl:  .  atorvastatin (LIPITOR) 40 MG tablet, Take 40 mg by mouth daily., Disp: , Rfl:  .  ezetimibe (ZETIA) 10 MG tablet, Take by mouth., Disp: , Rfl:  .  lisinopril (PRINIVIL,ZESTRIL) 2.5 MG tablet, Take 2.5 mg by mouth daily., Disp: , Rfl:  .  metFORMIN (GLUCOPHAGE) 500 MG tablet, Take 500 mg by mouth 2 (two) times daily with a meal., Disp: , Rfl:  .  metoprolol succinate (TOPROL-XL) 50 MG 24 hr tablet, Take 50 mg by mouth every 12 (twelve) hours. Take with or immediately following a meal., Disp: , Rfl:  .  pantoprazole (PROTONIX) 40 MG tablet, Take 40 mg by mouth daily., Disp: , Rfl:  .  traMADol (ULTRAM) 50 MG tablet, Take 0.5 tablets (25 mg total) by mouth every 6 (six) hours as needed. 1/2 tab (Patient not taking: Reported on 01/03/2018), Disp: 30 tablet, Rfl: 0  Past Medical History: Past Medical History:  Diagnosis Date  . Chronic diastolic heart failure ([place])   . Coronary artery disease due to lipid rich plaque   . DM II (diabetes mellitus, type II), controlled ([place])   . HLD (hyperlipidemia)   . HTN (hypertension)   . NSTEMI (non-ST elevated myocardial infarction) ([place])   . STEMI (ST elevation myocardial infarction) ([place]) 2004   s/p PCI  to LAD    Tobacco Use: Social History   Tobacco Use  Smoking Status Never Smoker  Smokeless Tobacco Never Used    Labs: Recent Review Flowsheet Data    There is no flowsheet data to display.       Exercise Target Goals: Exercise Program Goal: Individual exercise prescription set using results from initial 6 min walk test and THRR while considering  patient's activity barriers and safety.   Exercise Prescription Goal: Initial exercise prescription builds to 30-45 minutes a day of aerobic activity, 2-3 days per week.  Home exercise guidelines will be given to patient during program as part of exercise prescription that the participant will acknowledge.  Activity Barriers & Risk Stratification: Activity Barriers & Cardiac Risk Stratification - 01/03/18 1401      Activity Barriers & Cardiac Risk Stratification   Activity Barriers  Deconditioning;Muscular Weakness    Cardiac Risk Stratification  High   History of MI      6 Minute Walk: 6 Minute Walk    Row Name 01/03/18 1524         6 Minute Walk   Phase  Initial     Distance  1245 feet     Walk Time  6 minutes     # of Rest Breaks  0  MPH  2.36     METS  2.97     RPE  11     VO2 Peak  10.41     Symptoms  No     Resting HR  73 bpm     Resting BP  126/64     Resting Oxygen Saturation   98 %     Exercise Oxygen Saturation  during 6 min walk  96 %     Max Ex. HR  93 bpm     Max Ex. BP  146/66     2 Minute Post BP  124/62        Oxygen Initial Assessment:   Oxygen Re-Evaluation:   Oxygen Discharge (Final Oxygen Re-Evaluation):   Initial Exercise Prescription: Initial Exercise Prescription - 01/03/18 1500      Date of Initial Exercise RX and Referring Provider   Date  01/03/18    Referring Provider  Cloretta Ned MD      Treadmill   MPH  2.3    Grade  0.5    Minutes  15    METs  2.92      NuStep   Level  1    SPM  80    Minutes  15    METs  2.5      Arm Ergometer   Level  2     Watts  33    RPM  25    Minutes  15    METs  2.5      Prescription Details   Frequency (times per week)  2    Duration  Progress to 30 minutes of continuous aerobic without signs/symptoms of physical distress      Intensity   THRR 40-80% of Max Heartrate  104-135    Ratings of Perceived Exertion  11-13    Perceived Dyspnea  0-4      Progression   Progression  Continue to progress workloads to maintain intensity without signs/symptoms of physical distress.      Resistance Training   Training Prescription  Yes    Weight  3 lbs       Perform Capillary Blood Glucose checks as needed.  Exercise Prescription Changes: Exercise Prescription Changes    Row Name 01/03/18 1400 01/26/18 1500 02/03/18 1000 02/10/18 1100       Response to Exercise   Blood Pressure (Admit)  126/64  106/68  -  126/64    Blood Pressure (Exercise)  146/66  132/58  -  140/64    Blood Pressure (Exit)  124/62  122/64  -  124/62    Heart Rate (Admit)  73 bpm  75 bpm  -  81 bpm    Heart Rate (Exercise)  98 bpm  85 bpm  -  98 bpm    Heart Rate (Exit)  80 bpm  71 bpm  -  72 bpm    Oxygen Saturation (Admit)  98 %  -  -  -    Oxygen Saturation (Exercise)  96 %  -  -  -    Rating of Perceived Exertion (Exercise)  11  14  -  13    Symptoms  none  none  -  none    Comments  walk test results  second full day of exercise  -  -    Duration  -  Continue with 30 min of aerobic exercise without signs/symptoms of physical distress.  -  Continue with 30 min of  aerobic exercise without signs/symptoms of physical distress.    Intensity  -  THRR unchanged  -  THRR unchanged      Progression   Progression  -  Continue to progress workloads to maintain intensity without signs/symptoms of physical distress.  -  Continue to progress workloads to maintain intensity without signs/symptoms of physical distress.    Average METs  -  1.96  -  1.34      Resistance Training   Training Prescription  -  Yes  -  Yes    Weight  -  3 lbs   -  3 lbs    Reps  -  10-15  -  10-15      Interval Training   Interval Training  -  No  -  No      Treadmill   MPH  -  2.2  -  2.2    Grade  -  0  -  0    Minutes  -  15  -  15    METs  -  2.68  -  2.68      Recumbant Bike   Level  -  -  -  2    Watts  -  -  -  25    Minutes  -  -  -  15      NuStep   Level  -  1  -  -    Minutes  -  15  -  -    METs  -  1.9  -  -      Arm Ergometer   Level  -  2  -  -    Minutes  -  15  -  -    METs  -  1.9  -  -      Home Exercise Plan   Plans to continue exercise at  -  -  [employer] (comment) walking, [employer] (comment)    Frequency  -  -  Add 3 additional days to program exercise sessions.  Add 3 additional days to program exercise sessions.    Initial Home Exercises Provided  -  -  02/03/18  02/03/18       Exercise Comments: Exercise Comments    Row Name 01/11/18 1021           Exercise Comments  First full day of exercise!  Patient was oriented to gym and equipment including functions, settings, policies, and procedures.  Patient's individual exercise prescription and treatment plan were reviewed.  All starting workloads were established based on the results of the 6 minute walk test done at initial orientation visit.  The plan for exercise progression was also introduced and progression will be customized based on patient's performance and goals.          Exercise Goals and Review: Exercise Goals    Row Name 01/03/18 1530             Exercise Goals   Increase Physical Activity  Yes       Intervention  Provide advice, education, support and counseling about physical activity/exercise needs.;Develop an individualized exercise prescription for aerobic and resistive training based on initial evaluation findings, risk stratification, comorbidities and participant's personal goals.       Expected Outcomes  Short Term: Attend rehab on a regular basis to increase amount of physical activity.;Long Term:  Add in home exercise to make exercise  part of routine and to increase amount of physical activity.;Long Term: Exercising regularly at least 3-5 days a week.       Increase Strength and Stamina  Yes       Intervention  Develop an individualized exercise prescription for aerobic and resistive training based on initial evaluation findings, risk stratification, comorbidities and participant's personal goals.;Provide advice, education, support and counseling about physical activity/exercise needs.       Expected Outcomes  Short Term: Increase workloads from initial exercise prescription for resistance, speed, and METs.;Short Term: Perform resistance training exercises routinely during rehab and add in resistance training at home;Long Term: Improve cardiorespiratory fitness, muscular endurance and strength as measured by increased METs and functional capacity (6MWT)       Able to understand and use rate of perceived exertion (RPE) scale  Yes       Intervention  Provide education and explanation on how to use RPE scale       Expected Outcomes  Short Term: Able to use RPE daily in rehab to express subjective intensity level;Long Term:  Able to use RPE to guide intensity level when exercising independently       Knowledge and understanding of Target Heart Rate Range (THRR)  Yes       Intervention  Provide education and explanation of THRR including how the numbers were predicted and where they are located for reference       Expected Outcomes  Short Term: Able to use daily as guideline for intensity in rehab;Short Term: Able to state/look up THRR;Long Term: Able to use THRR to govern intensity when exercising independently       Able to check pulse independently  Yes       Intervention  Provide education and demonstration on how to check pulse in carotid and radial arteries.;Review the importance of being able to check your own pulse for safety during independent exercise       Expected Outcomes  Short Term: Able  to explain why pulse checking is important during independent exercise;Long Term: Able to check pulse independently and accurately       Understanding of Exercise Prescription  Yes       Intervention  Provide education, explanation, and written materials on patient's individual exercise prescription       Expected Outcomes  Short Term: Able to explain program exercise prescription;Long Term: Able to explain home exercise prescription to exercise independently          Exercise Goals Re-Evaluation : Exercise Goals Re-Evaluation    Row Name 01/11/18 1022 01/26/18 1526 02/03/18 1015 02/10/18 1154       Exercise Goal Re-Evaluation   Exercise Goals Review  Understanding of Exercise Prescription;Knowledge and understanding of Target Heart Rate Range (THRR);Able to understand and use rate of perceived exertion (RPE) scale  Increase Physical Activity;Understanding of Exercise Prescription;Increase Strength and Stamina  Increase Physical Activity;Understanding of Exercise Prescription;Increase Strength and Stamina;Able to understand and use rate of perceived exertion (RPE) scale;Knowledge and understanding of Target Heart Rate Range (THRR);Able to understand and use Dyspnea scale;Able to check pulse independently  Increase Physical Activity;Understanding of Exercise Prescription;Increase Strength and Stamina;Able to understand and use rate of perceived exertion (RPE) scale;Knowledge and understanding of Target Heart Rate Range (THRR);Able to understand and use Dyspnea scale;Able to check pulse independently    Comments  Reviewed RPE scale, THR and program prescription with pt today.  Pt voiced understanding and was given a copy of goals to take home.  Jenny Reichmann is off to a good start in rehab.  She has completed two full days of exercise.  We will continue to monitor her progress.   Jenny Reichmann is doing well with exercise.  She has been having some soreness after exercise from arm crank and weights. We are going to  switch arm crank out for recumbent bike.  She is thinking of buying a treadmill and she belongs to the [facility].   She will be cleared to drive again soon so she be able to start going to again. Reviewed home exercise with pt today.  Pt plans to continue to walk at home and return to the [facility] (once cleared to drive) for exercise.  Reviewed THR, pulse, RPE, sign and symptoms, and when to call 911 or MD.  Also discussed weather considerations and indoor options.  Pt voiced understanding.  Jenny Reichmann is doing great in rehab. She is working  at level 3 on the recumbant bike and seems to like it much better than the arm crank. She is still trying to increase her activity levels at home but plans to walk daily. She is still waiting to be cleared to drive.     Expected Outcomes  Short: Use RPE daily to regulate intensity. Long: Follow program prescription in THR.  Short: Begin to increase workloads.  Long: Continue to follow program prescription.   Short: Start to add in home exercise routinely.  Long: Continue to exercise more on her own and increase chest mobility.   Short: continue to attend rehab regularly. Long: increase overall stamina and strength.        Discharge Exercise Prescription (Final Exercise Prescription Changes): Exercise Prescription Changes - 02/10/18 1100      Response to Exercise   Blood Pressure (Admit)  126/64    Blood Pressure (Exercise)  140/64    Blood Pressure (Exit)  124/62    Heart Rate (Admit)  81 bpm    Heart Rate (Exercise)  98 bpm    Heart Rate (Exit)  72 bpm    Rating of Perceived Exertion (Exercise)  13    Symptoms  none    Duration  Continue with 30 min of aerobic exercise without signs/symptoms of physical distress.    Intensity  THRR unchanged      Progression   Progression  Continue to progress workloads to maintain intensity without signs/symptoms of physical distress.    Average METs  1.34      Resistance Training   Training Prescription  Yes    Weight  3 lbs     Reps  10-15      Interval Training   Interval Training  No      Treadmill   MPH  2.2    Grade  0    Minutes  15    METs  2.68      Recumbant Bike   Level  2    Watts  25    Minutes  15      Home Exercise Plan   Plans to continue exercise at  [employer] (comment)    Frequency  Add 3 additional days to program exercise sessions.    Initial Home Exercises Provided  02/03/18       Nutrition:  Target Goals: Understanding of nutrition guidelines, daily intake of sodium <155m, cholesterol <2073m calories 30% from fat and 7% or less from saturated fats, daily to have 5 or more servings of fruits and vegetables.  Biometrics: Pre Biometrics - 01/03/18  1530      Pre Biometrics   Height  5' 3.4" (1.61 m)    Weight  140 lb 9.6 oz (63.8 kg)    Waist Circumference  32.25 inches    Hip Circumference  38.75 inches    Waist to Hip Ratio  0.83 %    BMI (Calculated)  24.6    Single Leg Stand  30 seconds        Nutrition Therapy Plan and Nutrition Goals: Nutrition Therapy & Goals - 02/15/18 1208      Nutrition Therapy   Diet  TLC   follows a vegan diet   Drug/Food Interactions  Statins/Certain Fruits    Protein (specify units)  6-7    Fiber  25 grams    Whole Grain Foods  3 servings   chooses whole grains regularly   Saturated Fats  11 max. grams    Fruits and Vegetables  8 servings/day    Sodium  1500 grams      Personal Nutrition Goals   Nutrition Goal  Increase your dietary intake of unsaturated fats, which will improve your HDL. You can incorporate them by adding them to smoothies, eating them as snacks, adding them on to salads, etc.    Personal Goal #2  Aim to eat a variety of plant-based protein sources each day so that your body gets all of the amino acids it needs to build complete proteins and to prevent nutritional deficiencies    Personal Goal #3  Prioritize vegan sources of Vitamin B12 such as nutritional yeast    Comments  She has started to incorporate  physical activity in addtion to following a vegan diet (with the exception of salmon once every 2 weeks) which has helped her improve her HgbA1c and reduce LDL. Her HDL could still be improved. Does not salt food and eats a high fiber diet      Intervention Plan   Intervention  Prescribe, educate and counsel regarding individualized specific dietary modifications aiming towards targeted core components such as weight, hypertension, lipid management, diabetes, heart failure and other comorbidities.    Expected Outcomes  Short Term Goal: A plan has been developed with personal nutrition goals set during dietitian appointment.;Long Term Goal: Adherence to prescribed nutrition plan.;Short Term Goal: Understand basic principles of dietary content, such as calories, fat, sodium, cholesterol and nutrients.       Nutrition Assessments: Nutrition Assessments - 01/03/18 1055      MEDFICTS Scores   Pre Score  7       Nutrition Goals Re-Evaluation: Nutrition Goals Re-Evaluation    [place] Name 02/03/18 1038 02/15/18 1228           Goals   Current Weight  138 lb (62.6 kg)  -      Nutrition Goal  Meet with dietician next week  Increase your dietary intake of unsaturated fats, which will improve your HDL. You can incorporate them by adding them to smoothies, eating them as snacks, adding them on to salads, etc.      Comment  Appt scheduled for 8/6.  Jenny Reichmann is trying to lose weight. She is vegan and borderline diabetic.  She reads labels to limit her sodium and sugar intake.   Her overall dietary fat intake is fairly low based on diet recall. She reports to like chia seeds, flax seeds, hemp seeds, hummus, avocado, and almond butter. She has been incorporating salmon occasionally.      Expected Outcome  Short: Meet with  dietician.  Long: Continue to work on weight loss.   She will cook with and include sources of heart-healthy unsaturated fats in her diet regularly        Personal Goal #2 Re-Evaluation    Personal Goal #2  -  Aim to eat a variety of plant-based protein sources each day so that your body gets all of the amino acids it needs to make complete proteins and to prevent nutritional deficiencies        Personal Goal #3 Re-Evaluation   Personal Goal #3  -  Prioritize vegan sources of Vitamin B12 such as nutritional yeast         Nutrition Goals Discharge (Final Nutrition Goals Re-Evaluation): Nutrition Goals Re-Evaluation - 02/15/18 1228      Goals   Nutrition Goal  Increase your dietary intake of unsaturated fats, which will improve your HDL. You can incorporate them by adding them to smoothies, eating them as snacks, adding them on to salads, etc.    Comment  Her overall dietary fat intake is fairly low based on diet recall. She reports to like chia seeds, flax seeds, hemp seeds, hummus, avocado, and almond butter. She has been incorporating salmon occasionally.    Expected Outcome  She will cook with and include sources of heart-healthy unsaturated fats in her diet regularly      Personal Goal #2 Re-Evaluation   Personal Goal #2  Aim to eat a variety of plant-based protein sources each day so that your body gets all of the amino acids it needs to make complete proteins and to prevent nutritional deficiencies      Personal Goal #3 Re-Evaluation   Personal Goal #3  Prioritize vegan sources of Vitamin B12 such as nutritional yeast       Psychosocial: Target Goals: Acknowledge presence or absence of significant depression and/or stress, maximize coping skills, provide positive support system. Participant is able to verbalize types and ability to use techniques and skills needed for reducing stress and depression.   Initial Review & Psychosocial Screening: Initial Psych Review & Screening - 01/03/18 1403      Initial Review   Current issues with  None Identified      Family Dynamics   Good Support System?  Yes   Children     Barriers   Psychosocial barriers to participate in  program  There are no identifiable barriers or psychosocial needs.;The patient should benefit from training in stress management and relaxation.      Screening Interventions   Interventions  Encouraged to exercise;Provide feedback about the scores to participant;To provide support and resources with identified psychosocial needs    Expected Outcomes  Short Term goal: Utilizing psychosocial counselor, staff and physician to assist with identification of specific Stressors or current issues interfering with healing process. Setting desired goal for each stressor or current issue identified.;Long Term Goal: Stressors or current issues are controlled or eliminated.;Short Term goal: Identification and review with participant of any Quality of Life or Depression concerns found by scoring the questionnaire.;Long Term goal: The participant improves quality of Life and PHQ9 Scores as seen by post scores and/or verbalization of changes       Quality of Life Scores:  Quality of Life - 01/03/18 1056      Quality of Life   Select  Quality of Life      Quality of Life Scores   Health/Function Pre  19.23 %    Socioeconomic Pre  17.63 %  Psych/Spiritual Pre  24.43 %    Family Pre  25.3 %    GLOBAL Pre  20.77 %      Scores of 19 and below usually indicate a poorer quality of life in these areas.  A difference of  2-3 points is a clinically meaningful difference.  A difference of 2-3 points in the total score of the Quality of Life Index has been associated with significant improvement in overall quality of life, self-image, physical symptoms, and general health in studies assessing change in quality of life.  PHQ-9: Recent Review Flowsheet Data    Depression screen [facility] 2/9 01/03/2018   Decreased Interest 0   Down, Depressed, Hopeless 0   PHQ - 2 Score 0   Altered sleeping 1   Tired, decreased energy 0   Change in appetite 0   Feeling bad or failure about yourself  0   Trouble concentrating 0    Moving slowly or fidgety/restless 0   Suicidal thoughts 0   PHQ-9 Score 1   Difficult doing work/chores Not difficult at all     Interpretation of Total Score  Total Score Depression Severity:  1-4 = Minimal depression, 5-9 = Mild depression, 10-14 = Moderate depression, 15-19 = Moderately severe depression, 20-27 = Severe depression   Psychosocial Evaluation and Intervention:   Psychosocial Re-Evaluation: Psychosocial Re-Evaluation    [place] Name 02/03/18 1041             Psychosocial Re-Evaluation   Current issues with  Current Stress Concerns       Comments  Jenny Reichmann is doing well mentally.  She is bored being at home and limited acitivity post surgery. She will be cleared to drive again soon. Her lessend activity is also bothering her sleep.        Expected Outcomes  Short: Continue to build up activity levels.  Long: Continue to work towards more activity and back to normal.        Interventions  Stress management education;Encouraged to attend Cardiac Rehabilitation for the exercise         Initial Review   Source of Stress Concerns  Unable to participate in former interests or hobbies;Unable to perform yard/household activities          Psychosocial Discharge (Final Psychosocial Re-Evaluation): Psychosocial Re-Evaluation - 02/03/18 1041      Psychosocial Re-Evaluation   Current issues with  Current Stress Concerns    Comments  Jenny Reichmann is doing well mentally.  She is bored being at home and limited acitivity post surgery. She will be cleared to drive again soon. Her lessend activity is also bothering her sleep.     Expected Outcomes  Short: Continue to build up activity levels.  Long: Continue to work towards more activity and back to normal.     Interventions  Stress management education;Encouraged to attend Cardiac Rehabilitation for the exercise      Initial Review   Source of Stress Concerns  Unable to participate in former interests or hobbies;Unable to perform  yard/household activities       Vocational Rehabilitation: Provide vocational rehab assistance to qualifying candidates.   Vocational Rehab Evaluation & Intervention: Vocational Rehab - 01/03/18 1411      Initial Vocational Rehab Evaluation & Intervention   Assessment shows need for Vocational Rehabilitation  No       Education: Education Goals: Education classes will be provided on a variety of topics geared toward better understanding of heart health and risk factor modification.  Participant will state understanding/return demonstration of topics presented as noted by education test scores.  Learning Barriers/Preferences: Learning Barriers/Preferences - 01/03/18 1411      Learning Barriers/Preferences   Learning Barriers  None    Learning Preferences  None       Education Topics:  AED/CPR: - Group verbal and written instruction with the use of models to demonstrate the basic use of the AED with the basic ABC's of resuscitation.   General Nutrition Guidelines/Fats and Fiber: -Group instruction provided by verbal, written material, models and posters to present the general guidelines for heart healthy nutrition. Gives an explanation and review of dietary fats and fiber.   Cardiac Rehab from 02/15/2018 in [facility] Cardiac and Pulmonary Rehab  Date  01/18/18  Educator  CR  Instruction Review Code  1- Verbalizes Understanding      Controlling Sodium/Reading Food Labels: -Group verbal and written material supporting the discussion of sodium use in heart healthy nutrition. Review and explanation with models, verbal and written materials for utilization of the food label.   Cardiac Rehab from 02/15/2018 in [facility] Cardiac and Pulmonary Rehab  Date  01/25/18  Educator  SB  Instruction Review Code  1- Verbalizes Understanding      Exercise Physiology & General Exercise Guidelines: - Group verbal and written instruction with models to review the exercise physiology of the  cardiovascular system and associated critical values. Provides general exercise guidelines with specific guidelines to those with heart or lung disease.    Cardiac Rehab from 02/15/2018 in [facility] Cardiac and Pulmonary Rehab  Date  02/01/18  Educator  [facility]  Instruction Review Code  1- Verbalizes Understanding      Aerobic Exercise & Resistance Training: - Gives group verbal and written instruction on the various components of exercise. Focuses on aerobic and resistive training programs and the benefits of this training and how to safely progress through these programs..   Cardiac Rehab from 02/15/2018 in [facility] Cardiac and Pulmonary Rehab  Date  02/10/18  Educator  AS  Instruction Review Code  1- Verbalizes Understanding      Flexibility, Balance, Mind/Body Relaxation: Provides group verbal/written instruction on the benefits of flexibility and balance training, including mind/body exercise modes such as yoga, pilates and tai chi.  Demonstration and skill practice provided.   Cardiac Rehab from 02/15/2018 in [facility] Cardiac and Pulmonary Rehab  Date  02/15/18  Educator  AS  Instruction Review Code  3- Needs Reinforcement [arrived late to class]      Stress and Anxiety: - Provides group verbal and written instruction about the health risks of elevated stress and causes of high stress.  Discuss the correlation between heart/lung disease and anxiety and treatment options. Review healthy ways to manage with stress and anxiety.   Cardiac Rehab from 02/15/2018 in [facility] Cardiac and Pulmonary Rehab  Date  01/11/18  Educator  [facility]  Instruction Review Code  1- Verbalizes Understanding      Depression: - Provides group verbal and written instruction on the correlation between heart/lung disease and depressed mood, treatment options, and the stigmas associated with seeking treatment.   Anatomy & Physiology of the Heart: - Group verbal and written instruction and models provide basic cardiac anatomy and  physiology, with the coronary electrical and arterial systems. Review of Valvular disease and Heart Failure   Cardiac Procedures: - Group verbal and written instruction to review commonly prescribed medications for heart disease. Reviews the medication, class of the drug, and side effects. Includes the  steps to properly store meds and maintain the prescription regimen. (beta blockers and nitrates)   Cardiac Medications I: - Group verbal and written instruction to review commonly prescribed medications for heart disease. Reviews the medication, class of the drug, and side effects. Includes the steps to properly store meds and maintain the prescription regimen.   Cardiac Medications II: -Group verbal and written instruction to review commonly prescribed medications for heart disease. Reviews the medication, class of the drug, and side effects. (all other drug classes)    Go Sex-Intimacy & Heart Disease, Get SMART - Goal Setting: - Group verbal and written instruction through game format to discuss heart disease and the return to sexual intimacy. Provides group verbal and written material to discuss and apply goal setting through the application of the S.M.A.R.T. Method.   Other Matters of the Heart: - Provides group verbal, written materials and models to describe Stable Angina and Peripheral Artery. Includes description of the disease process and treatment options available to the cardiac patient.   Exercise & Equipment Safety: - Individual verbal instruction and demonstration of equipment use and safety with use of the equipment.   Cardiac Rehab from 02/15/2018 in [facility] Cardiac and Pulmonary Rehab  Date  01/03/18  Educator  [facility]  Instruction Review Code  1- Verbalizes Understanding      Infection Prevention: - Provides verbal and written material to individual with discussion of infection control including proper hand washing and proper equipment cleaning during exercise session.    Cardiac Rehab from 02/15/2018 in [facility] Cardiac and Pulmonary Rehab  Date  01/03/18  Educator  [facility]  Instruction Review Code  1- Verbalizes Understanding      Falls Prevention: - Provides verbal and written material to individual with discussion of falls prevention and safety.   Cardiac Rehab from 02/15/2018 in [facility] Cardiac and Pulmonary Rehab  Date  01/03/18  Educator  [facility]  Instruction Review Code  1- Verbalizes Understanding      Diabetes: - Individual verbal and written instruction to review signs/symptoms of diabetes, desired ranges of glucose level fasting, after meals and with exercise. Acknowledge that pre and post exercise glucose checks will be done for 3 sessions at entry of program.   Cardiac Rehab from 02/15/2018 in [facility] Cardiac and Pulmonary Rehab  Date  01/03/18  Educator  SB  Instruction Review Code  1- Verbalizes Understanding      Know Your Numbers and Risk Factors: -Group verbal and written instruction about important numbers in your health.  Discussion of what are risk factors and how they play a role in the disease process.  Review of Cholesterol, Blood Pressure, Diabetes, and BMI and the role they play in your overall health.   Sleep Hygiene: -Provides group verbal and written instruction about how sleep can affect your health.  Define sleep hygiene, discuss sleep cycles and impact of sleep habits. Review good sleep hygiene tips.    Cardiac Rehab from 02/15/2018 in [facility] Cardiac and Pulmonary Rehab  Date  02/03/18  Educator  [facility]  Instruction Review Code  1- Verbalizes Understanding      Other: -Provides group and verbal instruction on various topics (see comments)   Knowledge Questionnaire Score: Knowledge Questionnaire Score - 01/03/18 1055      Knowledge Questionnaire Score   Pre Score  22/26   test reviewed with pt today  Education [place]: Angina, Nutrition      Core Components/Risk Factors/Patient Goals at Admission: Personal Goals and Risk Factors at  Admission -  01/03/18 1403      Core Components/Risk Factors/Patient Goals on Admission    Weight Management  Yes;Weight Maintenance    Intervention  Weight Management: Develop a combined nutrition and exercise program designed to reach desired caloric intake, while maintaining appropriate intake of nutrient and fiber, sodium and fats, and appropriate energy expenditure required for the weight goal.;Weight Management: Provide education and appropriate resources to help participant work on and attain dietary goals.    Admit Weight  140 lb 9.6 oz (63.8 kg)    Expected Outcomes  Weight Maintenance: Understanding of the daily nutrition guidelines, which includes 25-35% calories from fat, 7% or less cal from saturated fats, less than 226m cholesterol, less than 1.5gm of sodium, & 5 or more servings of fruits and vegetables daily    Diabetes  Yes    Intervention  Provide education about signs/symptoms and action to take for hypo/hyperglycemia.;Provide education about proper nutrition, including hydration, and aerobic/resistive exercise prescription along with prescribed medications to achieve blood glucose in normal ranges: Fasting glucose 65-99 mg/dL    Expected Outcomes  Short Term: Participant verbalizes understanding of the signs/symptoms and immediate care of hyper/hypoglycemia, proper foot care and importance of medication, aerobic/resistive exercise and nutrition plan for blood glucose control.;Long Term: Attainment of HbA1C < 7%.    Heart Failure  Yes    Intervention  Provide a combined exercise and nutrition program that is supplemented with education, support and counseling about heart failure. Directed toward relieving symptoms such as shortness of breath, decreased exercise tolerance, and extremity edema.    Expected Outcomes  Improve functional capacity of life;Short term: Attendance in program 2-3 days a week with increased exercise capacity. Reported lower sodium intake. Reported increased fruit  and vegetable intake. Reports medication compliance.;Short term: Daily weights obtained and reported for increase. Utilizing diuretic protocols set by physician.;Long term: Adoption of self-care skills and reduction of barriers for early signs and symptoms recognition and intervention leading to self-care maintenance.    Hypertension  Yes    Intervention  Provide education on lifestyle modifcations including regular physical activity/exercise, weight management, moderate sodium restriction and increased consumption of fresh fruit, vegetables, and low fat dairy, alcohol moderation, and smoking cessation.;Monitor prescription use compliance.    Expected Outcomes  Short Term: Continued assessment and intervention until BP is < 140/938mHG in hypertensive participants. < 130/8066mG in hypertensive participants with diabetes, heart failure or chronic kidney disease.;Long Term: Maintenance of blood pressure at goal levels.    Lipids  Yes    Intervention  Provide education and support for participant on nutrition & aerobic/resistive exercise along with prescribed medications to achieve LDL <86m32mDL >40mg82m Expected Outcomes  Short Term: Participant states understanding of desired cholesterol values and is compliant with medications prescribed. Participant is following exercise prescription and nutrition guidelines.;Long Term: Cholesterol controlled with medications as prescribed, with individualized exercise RX and with personalized nutrition plan. Value goals: LDL < 86mg,59m > 40 mg.       Core Components/Risk Factors/Patient Goals Review:  Goals and Risk Factor Review    Row Name 02/03/18 1036             Core Components/Risk Factors/Patient Goals Review   Personal Goals Review  Weight Management/Obesity;Diabetes;Hypertension;Lipids       Review  Cindy Jenny Reichmannuck with her weight, she is trying to get down to 118.  Today she was 137 lbs and has been steady.  We will start to increase her workloads  to help with weight loss.  She is doing well with her blood sugars.  She does not check them very often at home.  Her lipids have been good.  She is borderline DM and her lipids recovered. Blood pressures have been good 120s/60s.  She does not check at home frequently.  She does have a cuff and uses it some with her son's help. She is going to start taking sugars and pressures some more at home to record to show to doctor.       Expected Outcomes  Short: Take BP and blood sugars more frequently and record.  Long: Continue monitor risk factors.           Core Components/Risk Factors/Patient Goals at Discharge (Final Review):  Goals and Risk Factor Review - 02/03/18 1036      Core Components/Risk Factors/Patient Goals Review   Personal Goals Review  Weight Management/Obesity;Diabetes;Hypertension;Lipids    Review  Jenny Reichmann is stuck with her weight, she is trying to get down to 118.  Today she was 137 lbs and has been steady.  We will start to increase her workloads to help with weight loss.  She is doing well with her blood sugars.  She does not check them very often at home.  Her lipids have been good.  She is borderline DM and her lipids recovered. Blood pressures have been good 120s/60s.  She does not check at home frequently.  She does have a cuff and uses it some with her son's help. She is going to start taking sugars and pressures some more at home to record to show to doctor.    Expected Outcomes  Short: Take BP and blood sugars more frequently and record.  Long: Continue monitor risk factors.        ITP Comments: ITP Comments    Row Name 01/03/18 1344 01/19/18 0554 02/16/18 0841       ITP Comments  Medical review completed today. ITP sent to Dr Loleta Chance for review, changes as needed and signature.  Documentation can be found in [facility] 12/28/2017  30 day review. Continue with ITP unless directed changes per Medical Director review.   New to program  30 day review completed. ITP sent to Dr. Ramonita Lab, covering for Dr. Emily Filbert, Medical Director of Cardiac Rehab. Continue with ITP unless changes are made by physician        Comments: 30 day review

## 2018-02-22 ENCOUNTER — Encounter: Payer: Medicare Other | Admitting: *Deleted

## 2018-02-22 DIAGNOSIS — Z951 Presence of aortocoronary bypass graft: Secondary | ICD-10-CM

## 2018-02-22 NOTE — Progress Notes (Signed)
Daily Session Note  Patient Details  Name: Melanie Wolfe MRN: [id number] Date of Birth: Jun 20, 1946 Referring Provider:     Cardiac Rehab from 01/03/2018 in [facility] Cardiac and Pulmonary Rehab  Referring Provider  Cloretta Ned MD      Encounter Date: 02/22/2018  Check In: Session Check In - 02/22/18 0928      Check-In   Supervising physician immediately available to respond to emergencies  See telemetry face sheet for immediately available ER MD    Location  [facility]-Cardiac & Pulmonary Rehab    Staff Present  Joellyn Rued, BS, PEC;Krista [place], RN BSN;Jessica [place], [place], RCEP, CCRP, Exercise Physiologist    Medication changes reported      No    Fall or balance concerns reported     No    Warm-up and Cool-down  Performed on first and last piece of equipment    Resistance Training Performed  Yes    VAD Patient?  No    PAD/SET Patient?  No      Pain Assessment   Currently in Pain?  No/denies    Multiple Pain Sites  No          Social History   Tobacco Use  Smoking Status Never Smoker  Smokeless Tobacco Never Used    Goals Met:  Independence with exercise equipment Exercise tolerated well No report of cardiac concerns or symptoms Strength training completed today  Goals Unmet:  Not Applicable  Comments: Pt able to follow exercise prescription today without complaint.  Will continue to monitor for progression.    Dr. Emily Filbert is Medical Director for [place] and [facility].

## 2018-02-24 ENCOUNTER — Encounter: Payer: Medicare Other | Admitting: *Deleted

## 2018-02-24 DIAGNOSIS — Z951 Presence of aortocoronary bypass graft: Secondary | ICD-10-CM | POA: Diagnosis not present

## 2018-02-24 NOTE — Progress Notes (Signed)
Daily Session Note  Patient Details  Name: Melanie Wolfe MRN: [id number] Date of Birth: 12-24-1945 Referring Provider:     Cardiac Rehab from 01/03/2018 in [facility] Cardiac and Pulmonary Rehab  Referring Provider  Cloretta Ned MD      Encounter Date: 02/24/2018  Check In: Session Check In - 02/24/18 0924      Check-In   Supervising physician immediately available to respond to emergencies  See telemetry face sheet for immediately available ER MD    Location  [facility]-Cardiac & Pulmonary Rehab    Staff Present  Joellyn Rued, BS, PEC;Carroll [place], RN, BSN;Yakir Wenke [place], MA, RCEP, CCRP, Exercise Physiologist;Joseph Tessie Fass RCP,RRT,BSRT    Medication changes reported      No    Fall or balance concerns reported     No    Warm-up and Cool-down  Performed on first and last piece of equipment    Resistance Training Performed  Yes    VAD Patient?  No    PAD/SET Patient?  No      Pain Assessment   Currently in Pain?  No/denies          Social History   Tobacco Use  Smoking Status Never Smoker  Smokeless Tobacco Never Used    Goals Met:  Independence with exercise equipment Exercise tolerated well No report of cardiac concerns or symptoms Strength training completed today  Goals Unmet:  Not Applicable  Comments: Pt able to follow exercise prescription today without complaint.  Will continue to monitor for progression.    Dr. Emily Filbert is Medical Director for [place] and [facility].

## 2018-03-01 ENCOUNTER — Encounter: Payer: Medicare Other | Admitting: *Deleted

## 2018-03-01 DIAGNOSIS — Z951 Presence of aortocoronary bypass graft: Secondary | ICD-10-CM | POA: Diagnosis not present

## 2018-03-01 NOTE — Progress Notes (Signed)
Daily Session Note  Patient Details  Name: Melanie Wolfe MRN: [id number] Date of Birth: May 12, 1946 Referring Provider:     Cardiac Rehab from 01/03/2018 in [facility] Cardiac and Pulmonary Rehab  Referring Provider  Cloretta Ned MD      Encounter Date: 03/01/2018  Check In: Session Check In - 03/01/18 1015      Check-In   Supervising physician immediately available to respond to emergencies  See telemetry face sheet for immediately available ER MD    Location  [facility]-Cardiac & Pulmonary Rehab    Staff Present  Joellyn Rued, BS, PEC;Susanne Bice, RN, BSN, CCRP;Ferry [place], MA, RCEP, CCRP, Exercise Physiologist;Amanda Oletta Darter, BA, ACSM CEP, Exercise Physiologist    Medication changes reported      No    Fall or balance concerns reported     No    Warm-up and Cool-down  Performed on first and last piece of equipment    Resistance Training Performed  Yes    VAD Patient?  No    PAD/SET Patient?  No      Pain Assessment   Currently in Pain?  No/denies          Social History   Tobacco Use  Smoking Status Never Smoker  Smokeless Tobacco Never Used    Goals Met:  Independence with exercise equipment Exercise tolerated well Personal goals reviewed No report of cardiac concerns or symptoms Strength training completed today  Goals Unmet:  Not Applicable  Comments: Pt able to follow exercise prescription today without complaint.  Will continue to monitor for progression. Reviewed home exercise with pt today.  Pt plans to continue to walk at home and return to [facility] for exercise.  Reviewed THR, pulse, RPE, sign and symptoms, NTG use, and when to call 911 or MD.  Also discussed weather considerations and indoor options.  Pt voiced understanding.    Dr. Emily Filbert is Medical Director for [place] and [facility].

## 2018-03-08 ENCOUNTER — Encounter: Payer: Medicare Other | Attending: Cardiovascular Disease | Admitting: *Deleted

## 2018-03-08 DIAGNOSIS — I2583 Coronary atherosclerosis due to lipid rich plaque: Secondary | ICD-10-CM | POA: Insufficient documentation

## 2018-03-08 DIAGNOSIS — I110 Hypertensive heart disease with heart failure: Secondary | ICD-10-CM | POA: Insufficient documentation

## 2018-03-08 DIAGNOSIS — E785 Hyperlipidemia, unspecified: Secondary | ICD-10-CM | POA: Insufficient documentation

## 2018-03-08 DIAGNOSIS — I5032 Chronic diastolic (congestive) heart failure: Secondary | ICD-10-CM | POA: Insufficient documentation

## 2018-03-08 DIAGNOSIS — I2510 Atherosclerotic heart disease of native coronary artery without angina pectoris: Secondary | ICD-10-CM | POA: Insufficient documentation

## 2018-03-08 DIAGNOSIS — E119 Type 2 diabetes mellitus without complications: Secondary | ICD-10-CM | POA: Diagnosis not present

## 2018-03-08 DIAGNOSIS — I252 Old myocardial infarction: Secondary | ICD-10-CM | POA: Diagnosis not present

## 2018-03-08 DIAGNOSIS — Z951 Presence of aortocoronary bypass graft: Secondary | ICD-10-CM | POA: Diagnosis present

## 2018-03-08 NOTE — Progress Notes (Signed)
Daily Session Note  Patient Details  Name: Melanie Wolfe MRN: [id number] Date of Birth: [birth year] Referring Provider:     Cardiac Rehab from 01/03/2018 in [facility] Cardiac and Pulmonary Rehab  Referring Provider  Cloretta Ned MD      Encounter Date: 03/08/2018  Check In: Session Check In - 03/08/18 0933      Check-In   Supervising physician immediately available to respond to emergencies  See telemetry face sheet for immediately available ER MD    Location  [facility]-Cardiac & Pulmonary Rehab    Staff Present  Nyoka Cowden, RN, BSN, [place], MA, RCEP, CCRP, Exercise Physiologist;Amanda Oletta Darter, [place], ACSM CEP, Exercise Physiologist    Medication changes reported      No    Fall or balance concerns reported     No    Warm-up and Cool-down  Performed on first and last piece of equipment    Resistance Training Performed  Yes    VAD Patient?  No    PAD/SET Patient?  No      Pain Assessment   Currently in Pain?  No/denies          Social History   Tobacco Use  Smoking Status Never Smoker  Smokeless Tobacco Never Used    Goals Met:  Independence with exercise equipment Exercise tolerated well No report of cardiac concerns or symptoms Strength training completed today  Goals Unmet:  Not Applicable  Comments: Pt able to follow exercise prescription today without complaint.  Will continue to monitor for progression.    Dr. Emily Filbert is Medical Director for [place] and [facility].

## 2018-03-10 DIAGNOSIS — Z951 Presence of aortocoronary bypass graft: Secondary | ICD-10-CM | POA: Diagnosis not present

## 2018-03-10 NOTE — Progress Notes (Signed)
Daily Session Note  Patient Details  Name: Melanie Wolfe MRN: [id number] Date of Birth: 1946/05/10 Referring Provider:     Cardiac Rehab from 01/03/2018 in [facility] Cardiac and Pulmonary Rehab  Referring Provider  Cloretta Ned MD      Encounter Date: 03/10/2018  Check In:      Social History   Tobacco Use  Smoking Status Never Smoker  Smokeless Tobacco Never Used    Goals Met:  Independence with exercise equipment Exercise tolerated well No report of cardiac concerns or symptoms Strength training completed today  Goals Unmet:  Not Applicable  Comments: Pt able to follow exercise prescription today without complaint.  Will continue to monitor for progression.   Dr. Emily Filbert is Medical Director for [place] and [facility].

## 2018-03-15 ENCOUNTER — Encounter: Payer: Medicare Other | Admitting: *Deleted

## 2018-03-15 DIAGNOSIS — Z951 Presence of aortocoronary bypass graft: Secondary | ICD-10-CM

## 2018-03-15 NOTE — Progress Notes (Signed)
Daily Session Note  Patient Details  Name: Melanie Wolfe MRN: [id number] Date of Birth: [birth year] Referring Provider:     Cardiac Rehab from 01/03/2018 in [facility] Cardiac and Pulmonary Rehab  Referring Provider  Cloretta Ned MD      Encounter Date: 03/15/2018  Check In: Session Check In - 03/15/18 0930      Check-In   Supervising physician immediately available to respond to emergencies  See telemetry face sheet for immediately available ER MD    Location  [facility]-Cardiac & Pulmonary Rehab    Staff Present  Nyoka Cowden, RN, BSN, [place], MA, RCEP, CCRP, Exercise Physiologist;Amanda Oletta Darter, [place], ACSM CEP, Exercise Physiologist    Medication changes reported      No    Fall or balance concerns reported     No    Warm-up and Cool-down  Performed on first and last piece of equipment    Resistance Training Performed  Yes    VAD Patient?  No    PAD/SET Patient?  No      Pain Assessment   Currently in Pain?  No/denies          Social History   Tobacco Use  Smoking Status Never Smoker  Smokeless Tobacco Never Used    Goals Met:  Proper associated with RPD/PD & O2 Sat Independence with exercise equipment Exercise tolerated well No report of cardiac concerns or symptoms Strength training completed today  Goals Unmet:  Not Applicable  Comments: Pt able to follow exercise prescription today without complaint.  Will continue to monitor for progression.    Dr. Emily Filbert is Medical Director for [place] and [facility].

## 2018-03-16 ENCOUNTER — Encounter: Payer: Self-pay | Admitting: *Deleted

## 2018-03-16 DIAGNOSIS — Z951 Presence of aortocoronary bypass graft: Secondary | ICD-10-CM

## 2018-03-16 NOTE — Progress Notes (Signed)
Cardiac Individual Treatment Plan  Patient Details  Name: Melanie Wolfe MRN: [id number] Date of Birth: 1945/10/30 Referring Provider:     Cardiac Rehab from 01/03/2018 in [facility] Cardiac and Pulmonary Rehab  Referring Provider  Cloretta Ned MD      Initial Encounter Date:    Cardiac Rehab from 01/03/2018 in [facility] Cardiac and Pulmonary Rehab  Date  01/03/18      Visit Diagnosis: S/P CABG x 4  Patient's Home Medications on Admission:  Current Outpatient Medications:  .  acetaminophen (TYLENOL) 325 MG tablet, Take 650 mg by mouth every 6 (six) hours as needed., Disp: , Rfl:  .  apixaban (ELIQUIS) 5 MG TABS tablet, Take 5 mg by mouth every 12 (twelve) hours., Disp: , Rfl:  .  aspirin EC 81 MG tablet, Take 81 mg by mouth daily., Disp: , Rfl:  .  atorvastatin (LIPITOR) 40 MG tablet, Take 40 mg by mouth daily., Disp: , Rfl:  .  ezetimibe (ZETIA) 10 MG tablet, Take by mouth., Disp: , Rfl:  .  lisinopril (PRINIVIL,ZESTRIL) 2.5 MG tablet, Take 2.5 mg by mouth daily., Disp: , Rfl:  .  metFORMIN (GLUCOPHAGE) 500 MG tablet, Take 500 mg by mouth 2 (two) times daily with a meal., Disp: , Rfl:  .  metoprolol succinate (TOPROL-XL) 50 MG 24 hr tablet, Take 50 mg by mouth every 12 (twelve) hours. Take with or immediately following a meal., Disp: , Rfl:  .  pantoprazole (PROTONIX) 40 MG tablet, Take 40 mg by mouth daily., Disp: , Rfl:  .  traMADol (ULTRAM) 50 MG tablet, Take 0.5 tablets (25 mg total) by mouth every 6 (six) hours as needed. 1/2 tab (Patient not taking: Reported on 01/03/2018), Disp: 30 tablet, Rfl: 0  Past Medical History: Past Medical History:  Diagnosis Date  . Chronic diastolic heart failure ([place])   . Coronary artery disease due to lipid rich plaque   . DM II (diabetes mellitus, type II), controlled ([place])   . HLD (hyperlipidemia)   . HTN (hypertension)   . NSTEMI (non-ST elevated myocardial infarction) ([place])   . STEMI (ST elevation myocardial infarction) ([place]) 2004   s/p PCI  to LAD    Tobacco Use: Social History   Tobacco Use  Smoking Status Never Smoker  Smokeless Tobacco Never Used    Labs: Recent Review Flowsheet Data    There is no flowsheet data to display.       Exercise Target Goals: Exercise Program Goal: Individual exercise prescription set using results from initial 6 min walk test and THRR while considering  patient's activity barriers and safety.   Exercise Prescription Goal: Initial exercise prescription builds to 30-45 minutes a day of aerobic activity, 2-3 days per week.  Home exercise guidelines will be given to patient during program as part of exercise prescription that the participant will acknowledge.  Activity Barriers & Risk Stratification: Activity Barriers & Cardiac Risk Stratification - 01/03/18 1401      Activity Barriers & Cardiac Risk Stratification   Activity Barriers  Deconditioning;Muscular Weakness    Cardiac Risk Stratification  High   History of MI      6 Minute Walk: 6 Minute Walk    Row Name 01/03/18 1524         6 Minute Walk   Phase  Initial     Distance  1245 feet     Walk Time  6 minutes     # of Rest Breaks  0  MPH  2.36     METS  2.97     RPE  11     VO2 Peak  10.41     Symptoms  No     Resting HR  73 bpm     Resting BP  126/64     Resting Oxygen Saturation   98 %     Exercise Oxygen Saturation  during 6 min walk  96 %     Max Ex. HR  93 bpm     Max Ex. BP  146/66     2 Minute Post BP  124/62        Oxygen Initial Assessment:   Oxygen Re-Evaluation:   Oxygen Discharge (Final Oxygen Re-Evaluation):   Initial Exercise Prescription: Initial Exercise Prescription - 01/03/18 1500      Date of Initial Exercise RX and Referring Provider   Date  01/03/18    Referring Provider  Cloretta Ned MD      Treadmill   MPH  2.3    Grade  0.5    Minutes  15    METs  2.92      NuStep   Level  1    SPM  80    Minutes  15    METs  2.5      Arm Ergometer   Level  2     Watts  33    RPM  25    Minutes  15    METs  2.5      Prescription Details   Frequency (times per week)  2    Duration  Progress to 30 minutes of continuous aerobic without signs/symptoms of physical distress      Intensity   THRR 40-80% of Max Heartrate  104-135    Ratings of Perceived Exertion  11-13    Perceived Dyspnea  0-4      Progression   Progression  Continue to progress workloads to maintain intensity without signs/symptoms of physical distress.      Resistance Training   Training Prescription  Yes    Weight  3 lbs       Perform Capillary Blood Glucose checks as needed.  Exercise Prescription Changes: Exercise Prescription Changes    Row Name 01/03/18 1400 01/26/18 1500 02/03/18 1000 02/10/18 1100 02/22/18 1200     Response to Exercise   Blood Pressure (Admit)  126/64  106/68  -  126/64  126/64   Blood Pressure (Exercise)  146/66  132/58  -  140/64  132/72   Blood Pressure (Exit)  124/62  122/64  -  124/62  128/64   Heart Rate (Admit)  73 bpm  75 bpm  -  81 bpm  67 bpm   Heart Rate (Exercise)  98 bpm  85 bpm  -  98 bpm  97 bpm   Heart Rate (Exit)  80 bpm  71 bpm  -  72 bpm  71 bpm   Oxygen Saturation (Admit)  98 %  -  -  -  -   Oxygen Saturation (Exercise)  96 %  -  -  -  -   Rating of Perceived Exertion (Exercise)  11  14  -  13  13   Symptoms  none  none  -  none  none   Comments  walk test results  second full day of exercise  -  -  -   Duration  -  Continue with 30 min of aerobic  exercise without signs/symptoms of physical distress.  -  Continue with 30 min of aerobic exercise without signs/symptoms of physical distress.  Continue with 30 min of aerobic exercise without signs/symptoms of physical distress.   Intensity  -  THRR unchanged  -  THRR unchanged  THRR unchanged     Progression   Progression  -  Continue to progress workloads to maintain intensity without signs/symptoms of physical distress.  -  Continue to progress workloads to maintain intensity  without signs/symptoms of physical distress.  Continue to progress workloads to maintain intensity without signs/symptoms of physical distress.   Average METs  -  1.96  -  1.34  1.34     Resistance Training   Training Prescription  -  Yes  -  Yes  Yes   Weight  -  3 lbs  -  3 lbs  3 lbs   Reps  -  10-15  -  10-15  10-15     Interval Training   Interval Training  -  No  -  No  -     Treadmill   MPH  -  2.2  -  2.2  2.2   Grade  -  0  -  0  0   Minutes  -  15  -  15  15   METs  -  2.68  -  2.68  2.68     Recumbant Bike   Level  -  -  -  2  -   Watts  -  -  -  25  -   Minutes  -  -  -  15  -     NuStep   Level  -  1  -  -  3   Minutes  -  15  -  -  15   METs  -  1.9  -  -  1.9     Arm Ergometer   Level  -  2  -  -  -   Minutes  -  15  -  -  -   METs  -  1.9  -  -  -     Home Exercise Plan   Plans to continue exercise at  -  -  [employer] (comment) walking, [employer] (comment)  [employer] (comment)   Frequency  -  -  Add 3 additional days to program exercise sessions.  Add 3 additional days to program exercise sessions.  Add 3 additional days to program exercise sessions.   Initial Home Exercises Provided  -  -  02/03/18  02/03/18  02/03/18   Row Name 03/01/18 1000 03/08/18 1600           Response to Exercise   Blood Pressure (Admit)  -  128/62      Blood Pressure (Exercise)  -  112/68      Blood Pressure (Exit)  -  130/72      Heart Rate (Admit)  -  77 bpm      Heart Rate (Exercise)  -  104 bpm      Heart Rate (Exit)  -  63 bpm      Rating of Perceived Exertion (Exercise)  -  12      Symptoms  -  none      Duration  -  Continue with 30 min of aerobic exercise without signs/symptoms of physical distress.  Intensity  -  THRR unchanged        Progression   Progression  -  Continue to progress workloads to maintain intensity without signs/symptoms of physical distress.      Average METs  -  2.78        Resistance Training    Training Prescription  -  Yes      Weight  -  3 lbs      Reps  -  10-15        Interval Training   Interval Training  -  No        Treadmill   MPH  -  2.2      Grade  -  0      Minutes  -  15      METs  -  2.68        Recumbant Bike   Level  -  2      Watts  -  18      Minutes  -  15      METs  -  3.08        NuStep   Level  -  3      Minutes  -  15      METs  -  2.5        Home Exercise Plan   Plans to continue exercise at  [employer] (comment)  [employer]nsic [employer] (comment)      Frequency  Add 3 additional days to program exercise sessions.  Add 3 additional days to program exercise sessions.      Initial Home Exercises Provided  02/03/18  02/03/18         Exercise Comments: Exercise Comments    Row Name 01/11/18 1021           Exercise Comments  First full day of exercise!  Patient was oriented to gym and equipment including functions, settings, policies, and procedures.  Patient's individual exercise prescription and treatment plan were reviewed.  All starting workloads were established based on the results of the 6 minute walk test done at initial orientation visit.  The plan for exercise progression was also introduced and progression will be customized based on patient's performance and goals.          Exercise Goals and Review: Exercise Goals    Row Name 01/03/18 1530             Exercise Goals   Increase Physical Activity  Yes       Intervention  Provide advice, education, support and counseling about physical activity/exercise needs.;Develop an individualized exercise prescription for aerobic and resistive training based on initial evaluation findings, risk stratification, comorbidities and participant's personal goals.       Expected Outcomes  Short Term: Attend rehab on a regular basis to increase amount of physical activity.;Long Term: Add in home exercise to make exercise part of routine and to increase amount of physical activity.;Long Term:  Exercising regularly at least 3-5 days a week.       Increase Strength and Stamina  Yes       Intervention  Develop an individualized exercise prescription for aerobic and resistive training based on initial evaluation findings, risk stratification, comorbidities and participant's personal goals.;Provide advice, education, support and counseling about physical activity/exercise needs.       Expected Outcomes  Short Term: Increase workloads from initial exercise prescription for resistance, speed, and METs.;Short Term: Perform resistance training exercises  routinely during rehab and add in resistance training at home;Long Term: Improve cardiorespiratory fitness, muscular endurance and strength as measured by increased METs and functional capacity (6MWT)       Able to understand and use rate of perceived exertion (RPE) scale  Yes       Intervention  Provide education and explanation on how to use RPE scale       Expected Outcomes  Short Term: Able to use RPE daily in rehab to express subjective intensity level;Long Term:  Able to use RPE to guide intensity level when exercising independently       Knowledge and understanding of Target Heart Rate Range (THRR)  Yes       Intervention  Provide education and explanation of THRR including how the numbers were predicted and where they are located for reference       Expected Outcomes  Short Term: Able to use daily as guideline for intensity in rehab;Short Term: Able to state/look up THRR;Long Term: Able to use THRR to govern intensity when exercising independently       Able to check pulse independently  Yes       Intervention  Provide education and demonstration on how to check pulse in carotid and radial arteries.;Review the importance of being able to check your own pulse for safety during independent exercise       Expected Outcomes  Short Term: Able to explain why pulse checking is important during independent exercise;Long Term: Able to check pulse  independently and accurately       Understanding of Exercise Prescription  Yes       Intervention  Provide education, explanation, and written materials on patient's individual exercise prescription       Expected Outcomes  Short Term: Able to explain program exercise prescription;Long Term: Able to explain home exercise prescription to exercise independently          Exercise Goals Re-Evaluation : Exercise Goals Re-Evaluation    Row Name 01/11/18 1022 01/26/18 1526 02/03/18 1015 02/10/18 1154 02/22/18 1230     Exercise Goal Re-Evaluation   Exercise Goals Review  Understanding of Exercise Prescription;Knowledge and understanding of Target Heart Rate Range (THRR);Able to understand and use rate of perceived exertion (RPE) scale  Increase Physical Activity;Understanding of Exercise Prescription;Increase Strength and Stamina  Increase Physical Activity;Understanding of Exercise Prescription;Increase Strength and Stamina;Able to understand and use rate of perceived exertion (RPE) scale;Knowledge and understanding of Target Heart Rate Range (THRR);Able to understand and use Dyspnea scale;Able to check pulse independently  Increase Physical Activity;Understanding of Exercise Prescription;Increase Strength and Stamina;Able to understand and use rate of perceived exertion (RPE) scale;Knowledge and understanding of Target Heart Rate Range (THRR);Able to understand and use Dyspnea scale;Able to check pulse independently  Increase Physical Activity;Understanding of Exercise Prescription;Increase Strength and Stamina;Able to understand and use rate of perceived exertion (RPE) scale;Knowledge and understanding of Target Heart Rate Range (THRR);Able to understand and use Dyspnea scale;Able to check pulse independently   Comments  Reviewed RPE scale, THR and program prescription with pt today.  Pt voiced understanding and was given a copy of goals to take home.   Jenny Reichmann is off to a good start in rehab.  She has  completed two full days of exercise.  We will continue to monitor her progress.   Jenny Reichmann is doing well with exercise.  She has been having some soreness after exercise from arm crank and weights. We are going to switch arm crank out for recumbent  bike.  She is thinking of buying a treadmill and she belongs to the [facility].   She will be cleared to drive again soon so she be able to start going to again. Reviewed home exercise with pt today.  Pt plans to continue to walk at home and return to the [facility] (once cleared to drive) for exercise.  Reviewed THR, pulse, RPE, sign and symptoms, and when to call 911 or MD.  Also discussed weather considerations and indoor options.  Pt voiced understanding.  Jenny Reichmann is doing great in rehab. She is working  at level 3 on the recumbant bike and seems to like it much better than the arm crank. She is still trying to increase her activity levels at home but plans to walk daily. She is still waiting to be cleared to drive.   Jenny Reichmann is doing well in rehab. She still likes the recumbant bike much better than the arm crank, she says she can breath better. She has increased to level 3 on hte Nu Step.    Expected Outcomes  Short: Use RPE daily to regulate intensity. Long: Follow program prescription in THR.  Short: Begin to increase workloads.  Long: Continue to follow program prescription.   Short: Start to add in home exercise routinely.  Long: Continue to exercise more on her own and increase chest mobility.   Short: continue to attend rehab regularly. Long: increase overall stamina and strength.   Short: add 3 days of exercise at home Long: become comfortable exercising independently.    [place]ie Name 03/01/18 1007 03/08/18 1643           Exercise Goal Re-Evaluation   Exercise Goals Review  Increase Physical Activity;Increase Strength and Stamina;Understanding of Exercise Prescription  Increase Physical Activity;Increase Strength and Stamina;Understanding of Exercise Prescription       Comments  Jenny Reichmann is doing well in rehab.  She has been walking at home on her off days.  She is aiming to work her way up to three miles at home.  She still feels like she needs to use mroe arms at home.  She is planning to go to [facility] with son tomorrow to start rebuilding her muscles.   She is still struggling with her ankle and work on getting upstairs to her bedroom again.   She has 14 steps to get up there.   She is going back to work part time. Reviewed home exercise with pt today.  Pt plans to continue to walk at home and return to [facility] for exercise.  Reviewed THR, pulse, RPE, sign and symptoms, NTG use, and when to call 911 or MD.  Also discussed weather considerations and indoor options.  Pt voiced  Jenny Reichmann has been doing well in rehab. She continues to arrive late to class but is exercising on her off days.  She is up to 2.6 METs on the NuStep.  We will continue to monitor her progress.       Expected Outcomes  Short: Add in home exercise.  Long: Continue to build strength and stamina.   Short: Continue to increase workloads.  Long: Continue to increase home exercise.          Discharge Exercise Prescription (Final Exercise Prescription Changes): Exercise Prescription Changes - 03/08/18 1600      Response to Exercise   Blood Pressure (Admit)  128/62    Blood Pressure (Exercise)  112/68    Blood Pressure (Exit)  130/72    Heart Rate (Admit)  77  bpm    Heart Rate (Exercise)  104 bpm    Heart Rate (Exit)  63 bpm    Rating of Perceived Exertion (Exercise)  12    Symptoms  none    Duration  Continue with 30 min of aerobic exercise without signs/symptoms of physical distress.    Intensity  THRR unchanged      Progression   Progression  Continue to progress workloads to maintain intensity without signs/symptoms of physical distress.    Average METs  2.78      Resistance Training   Training Prescription  Yes    Weight  3 lbs    Reps  10-15      Interval Training   Interval Training  No       Treadmill   MPH  2.2    Grade  0    Minutes  15    METs  2.68      Recumbant Bike   Level  2    Watts  18    Minutes  15    METs  3.08      NuStep   Level  3    Minutes  15    METs  2.5      Home Exercise Plan   Plans to continue exercise at  [employer] (comment)    Frequency  Add 3 additional days to program exercise sessions.    Initial Home Exercises Provided  02/03/18       Nutrition:  Target Goals: Understanding of nutrition guidelines, daily intake of sodium '1500mg'$ , cholesterol '200mg'$ , calories 30% from fat and 7% or less from saturated fats, daily to have 5 or more servings of fruits and vegetables.  Biometrics: Pre Biometrics - 01/03/18 1530      Pre Biometrics   Height  5' 3.4" (1.61 m)    Weight  140 lb 9.6 oz (63.8 kg)    Waist Circumference  32.25 inches    Hip Circumference  38.75 inches    Waist to Hip Ratio  0.83 %    BMI (Calculated)  24.6    Single Leg Stand  30 seconds        Nutrition Therapy Plan and Nutrition Goals: Nutrition Therapy & Goals - 02/15/18 1208      Nutrition Therapy   Diet  TLC   follows a vegan diet   Drug/Food Interactions  Statins/Certain Fruits    Protein (specify units)  6-7    Fiber  25 grams    Whole Grain Foods  3 servings   chooses whole grains regularly   Saturated Fats  11 max. grams    Fruits and Vegetables  8 servings/day    Sodium  1500 grams      Personal Nutrition Goals   Nutrition Goal  Increase your dietary intake of unsaturated fats, which will improve your HDL. You can incorporate them by adding them to smoothies, eating them as snacks, adding them on to salads, etc.    Personal Goal #2  Aim to eat a variety of plant-based protein sources each day so that your body gets all of the amino acids it needs to build complete proteins and to prevent nutritional deficiencies    Personal Goal #3  Prioritize vegan sources of Vitamin B12 such as nutritional yeast    Comments  She has started to  incorporate physical activity in addtion to following a vegan diet (with the exception of salmon once every 2 weeks) which has helped  her improve her HgbA1c and reduce LDL. Her HDL could still be improved. Does not salt food and eats a high fiber diet      Intervention Plan   Intervention  Prescribe, educate and counsel regarding individualized specific dietary modifications aiming towards targeted core components such as weight, hypertension, lipid management, diabetes, heart failure and other comorbidities.    Expected Outcomes  Short Term Goal: A plan has been developed with personal nutrition goals set during dietitian appointment.;Long Term Goal: Adherence to prescribed nutrition plan.;Short Term Goal: Understand basic principles of dietary content, such as calories, fat, sodium, cholesterol and nutrients.       Nutrition Assessments: Nutrition Assessments - 01/03/18 1055      MEDFICTS Scores   Pre Score  7       Nutrition Goals Re-Evaluation: Nutrition Goals Re-Evaluation    Row Name 02/03/18 1038 02/15/18 1228 03/01/18 1019         Goals   Current Weight  138 lb (62.6 kg)  -  -     Nutrition Goal  Meet with dietician next week  Increase your dietary intake of unsaturated fats, which will improve your HDL. You can incorporate them by adding them to smoothies, eating them as snacks, adding them on to salads, etc.  Increase undsaturated fats, more protein     Comment  Appt scheduled for 8/6.  Jenny Reichmann is trying to lose weight. She is vegan and borderline diabetic.  She reads labels to limit her sodium and sugar intake.   Her overall dietary fat intake is fairly low based on diet recall. She reports to like chia seeds, flax seeds, hemp seeds, hummus, avocado, and almond butter. She has been incorporating salmon occasionally.  Jenny Reichmann has been trying to make changes in her diet.  She has added in salmon and avacados to diet.  She has tried the flax seeds and has used a few chia seeds.  She is  adding them to her smoothies along with veggie protein.  She is also planning to add in a vegetable drink in the afternoon as weel.      Expected Outcome  Short: Meet with dietician.  Long: Continue to work on weight loss.   She will cook with and include sources of heart-healthy unsaturated fats in her diet regularly  Short: Continue to add in unsaturated fats.  Long: Continue to follow heart healthy diet.        Personal Goal #2 Re-Evaluation   Personal Goal #2  -  Aim to eat a variety of plant-based protein sources each day so that your body gets all of the amino acids it needs to make complete proteins and to prevent nutritional deficiencies  -       Personal Goal #3 Re-Evaluation   Personal Goal #3  -  Prioritize vegan sources of Vitamin B12 such as nutritional yeast  -        Nutrition Goals Discharge (Final Nutrition Goals Re-Evaluation): Nutrition Goals Re-Evaluation - 03/01/18 1019      Goals   Nutrition Goal  Increase undsaturated fats, more protein    Comment  Jenny Reichmann has been trying to make changes in her diet.  She has added in salmon and avacados to diet.  She has tried the flax seeds and has used a few chia seeds.  She is adding them to her smoothies along with veggie protein.  She is also planning to add in a vegetable drink in the afternoon as weel.  Expected Outcome  Short: Continue to add in unsaturated fats.  Long: Continue to follow heart healthy diet.        Psychosocial: Target Goals: Acknowledge presence or absence of significant depression and/or stress, maximize coping skills, provide positive support system. Participant is able to verbalize types and ability to use techniques and skills needed for reducing stress and depression.   Initial Review & Psychosocial Screening: Initial Psych Review & Screening - 01/03/18 1403      Initial Review   Current issues with  None Identified      Family Dynamics   Good Support System?  Yes   Children     Barriers    Psychosocial barriers to participate in program  There are no identifiable barriers or psychosocial needs.;The patient should benefit from training in stress management and relaxation.      Screening Interventions   Interventions  Encouraged to exercise;Provide feedback about the scores to participant;To provide support and resources with identified psychosocial needs    Expected Outcomes  Short Term goal: Utilizing psychosocial counselor, staff and physician to assist with identification of specific Stressors or current issues interfering with healing process. Setting desired goal for each stressor or current issue identified.;Long Term Goal: Stressors or current issues are controlled or eliminated.;Short Term goal: Identification and review with participant of any Quality of Life or Depression concerns found by scoring the questionnaire.;Long Term goal: The participant improves quality of Life and PHQ9 Scores as seen by post scores and/or verbalization of changes       Quality of Life Scores:  Quality of Life - 01/03/18 1056      Quality of Life   Select  Quality of Life      Quality of Life Scores   Health/Function Pre  19.23 %    Socioeconomic Pre  17.63 %    Psych/Spiritual Pre  24.43 %    Family Pre  25.3 %    GLOBAL Pre  20.77 %      Scores of 19 and below usually indicate a poorer quality of life in these areas.  A difference of  2-3 points is a clinically meaningful difference.  A difference of 2-3 points in the total score of the Quality of Life Index has been associated with significant improvement in overall quality of life, self-image, physical symptoms, and general health in studies assessing change in quality of life.  PHQ-9: Recent Review Flowsheet Data    Depression screen [facility] 2/9 01/03/2018   Decreased Interest 0   Down, Depressed, Hopeless 0   PHQ - 2 Score 0   Altered sleeping 1   Tired, decreased energy 0   Change in appetite 0   Feeling bad or failure about  yourself  0   Trouble concentrating 0   Moving slowly or fidgety/restless 0   Suicidal thoughts 0   PHQ-9 Score 1   Difficult doing work/chores Not difficult at all     Interpretation of Total Score  Total Score Depression Severity:  1-4 = Minimal depression, 5-9 = Mild depression, 10-14 = Moderate depression, 15-19 = Moderately severe depression, 20-27 = Severe depression   Psychosocial Evaluation and Intervention: Psychosocial Evaluation - 02/22/18 1036      Psychosocial Evaluation & Interventions   Interventions  Stress management education;Relaxation education;Encouraged to exercise with the program and follow exercise prescription    Comments  Counselor met with Ms. Jeannine Boga Caren Griffins) today for initial psychosocial evaluation.  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  Tika has a strong support system with a spouse of 57 years (who works out of state most of the time); (3) adult children and some friends and a sister who live locally.  Simcha was diagnosed with Diabetes recently but is working hard to bring those scores down to get off the medication.  She reports sleeping "okay" with approximately 6 hours per night and she has a good appetite.  Zakariah denies a history of depression or anxiety or any current symptoms.  She is typically in a positive mood.  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  Seynabou has goals to increase her muscle tone and get stronger overall.  She will be followed by staff.      Expected Outcomes  Short:  Claude attended the stress management component of this program today and admits she needs to set better boundaries and limits with others to decrease her stress.  She will also benefit from consistent exercise for stress and to improve her muscle tone.  Long:  Kenly will continue to exercise and practice positive stress  management strategies for her health and mental health.      Continue Psychosocial Services   Follow up required by staff       Psychosocial Re-Evaluation: Psychosocial Re-Evaluation    [place] Name 02/03/18 1041 03/01/18 1016           Psychosocial Re-Evaluation   Current issues with  Current Stress Concerns  Current Stress Concerns;Current Sleep Concerns      Comments  Jenny Reichmann is doing well mentally.  She is bored being at home and limited acitivity post surgery. She will be cleared to drive again soon. Her lessend activity is also bothering her sleep.   Jenny Reichmann is doing well mentally.  She had an off week last week feeling blue.  She had wanted to go to [place] with her family but she decided to stay home.  She has been cleared to drive and is going back to work some.  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  She is still not sleeping well, she is hoping that by increasing her activity she will start sleeping better.       Expected Outcomes  Short: Continue to build up activity levels.  Long: Continue to work towards more activity and back to normal.   Short: Continue to build activity up.  Long: Continue to work toward better sleep and coping with stress.       Interventions  Stress management education;Encouraged to attend Cardiac Rehabilitation for the exercise  Stress management education;Encouraged to attend Cardiac Rehabilitation for the exercise      Continue Psychosocial Services   -  Follow up required by staff        Initial Review   Source of Stress Concerns  Unable to participate in former interests or hobbies;Unable to perform yard/household activities  -         Psychosocial Discharge (Final Psychosocial Re-Evaluation): Psychosocial Re-Evaluation - 03/01/18 1016      Psychosocial Re-Evaluation   Current issues with  Current Stress Concerns;Current Sleep Concerns    Comments  Jenny Reichmann is doing well mentally.  She had an off week last week feeling blue.  She had  wanted to go to [place] with her family but she decided to stay home.  She has  been cleared to drive and is going back to work some.  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  She is still not sleeping well, she is hoping that by increasing her activity she will start sleeping better.     Expected Outcomes  Short: Continue to build activity up.  Long: Continue to work toward better sleep and coping with stress.     Interventions  Stress management education;Encouraged to attend Cardiac Rehabilitation for the exercise    Continue Psychosocial Services   Follow up required by staff       Vocational Rehabilitation: Provide vocational rehab assistance to qualifying candidates.   Vocational Rehab Evaluation & Intervention: Vocational Rehab - 01/03/18 1411      Initial Vocational Rehab Evaluation & Intervention   Assessment shows need for Vocational Rehabilitation  No       Education: Education Goals: Education classes will be provided on a variety of topics geared toward better understanding of heart health and risk factor modification. Participant will state understanding/return demonstration of topics presented as noted by education test scores.  Learning Barriers/Preferences: Learning Barriers/Preferences - 01/03/18 1411      Learning Barriers/Preferences   Learning Barriers  None    Learning Preferences  None       Education Topics:  AED/CPR: - Group verbal and written instruction with the use of models to demonstrate the basic use of the AED with the basic ABC's of resuscitation.   General Nutrition Guidelines/Fats and Fiber: -Group instruction provided by verbal, written material, models and posters to present the general guidelines for heart healthy nutrition. Gives an explanation and review of dietary fats and fiber.   Cardiac Rehab from 03/15/2018 in [facility] Cardiac and Pulmonary Rehab  Date  03/15/18  Educator  LB  Instruction Review Code  1- Verbalizes  Understanding      Controlling Sodium/Reading Food Labels: -Group verbal and written material supporting the discussion of sodium use in heart healthy nutrition. Review and explanation with models, verbal and written materials for utilization of the food label.   Cardiac Rehab from 03/15/2018 in [facility] Cardiac and Pulmonary Rehab  Date  01/25/18  Educator  SB  Instruction Review Code  1- Verbalizes Understanding      Exercise Physiology & General Exercise Guidelines: - Group verbal and written instruction with models to review the exercise physiology of the cardiovascular system and associated critical values. Provides general exercise guidelines with specific guidelines to those with heart or lung disease.    Cardiac Rehab from 03/15/2018 in [facility] Cardiac and Pulmonary Rehab  Date  02/01/18  Educator  [facility]  Instruction Review Code  1- Verbalizes Understanding      Aerobic Exercise & Resistance Training: - Gives group verbal and written instruction on the various components of exercise. Focuses on aerobic and resistive training programs and the benefits of this training and how to safely progress through these programs..   Cardiac Rehab from 03/15/2018 in [facility] Cardiac and Pulmonary Rehab  Date  02/10/18  Educator  AS  Instruction Review Code  1- Verbalizes Understanding      Flexibility, Balance, Mind/Body Relaxation: Provides group verbal/written instruction on the benefits of flexibility and balance training, including mind/body exercise modes such as yoga, pilates and tai chi.  Demonstration and skill practice provided.   Cardiac Rehab from 03/15/2018 in [facility] Cardiac and Pulmonary Rehab  Date  02/15/18  Educator  AS  Instruction Review Code  3- Needs Reinforcement [arrived late to class]  Stress and Anxiety: - Provides group verbal and written instruction about the health risks of elevated stress and causes of high stress.  Discuss the correlation between heart/lung disease and  anxiety and treatment options. Review healthy ways to manage with stress and anxiety.   Cardiac Rehab from 03/15/2018 in [facility] Cardiac and Pulmonary Rehab  Date  02/22/18  Educator  [facility]  Instruction Review Code  1- Verbalizes Understanding      Depression: - Provides group verbal and written instruction on the correlation between heart/lung disease and depressed mood, treatment options, and the stigmas associated with seeking treatment.   Anatomy & Physiology of the Heart: - Group verbal and written instruction and models provide basic cardiac anatomy and physiology, with the coronary electrical and arterial systems. Review of Valvular disease and Heart Failure   Cardiac Rehab from 03/15/2018 in [facility] Cardiac and Pulmonary Rehab  Date  02/24/18  Educator  CE  Instruction Review Code  1- Verbalizes Understanding      Cardiac Procedures: - Group verbal and written instruction to review commonly prescribed medications for heart disease. Reviews the medication, class of the drug, and side effects. Includes the steps to properly store meds and maintain the prescription regimen. (beta blockers and nitrates)   Cardiac Medications I: - Group verbal and written instruction to review commonly prescribed medications for heart disease. Reviews the medication, class of the drug, and side effects. Includes the steps to properly store meds and maintain the prescription regimen.   Cardiac Rehab from 03/15/2018 in [facility] Cardiac and Pulmonary Rehab  Date  03/01/18  Educator  SB  Instruction Review Code  1- Verbalizes Understanding      Cardiac Medications II: -Group verbal and written instruction to review commonly prescribed medications for heart disease. Reviews the medication, class of the drug, and side effects. (all other drug classes)    Go Sex-Intimacy & Heart Disease, Get SMART - Goal Setting: - Group verbal and written instruction through game format to discuss heart disease and the return to  sexual intimacy. Provides group verbal and written material to discuss and apply goal setting through the application of the S.M.A.R.T. Method.   Other Matters of the Heart: - Provides group verbal, written materials and models to describe Stable Angina and Peripheral Artery. Includes description of the disease process and treatment options available to the cardiac patient.   Cardiac Rehab from 03/15/2018 in [facility] Cardiac and Pulmonary Rehab  Date  02/24/18  Educator  CE  Instruction Review Code  1- Verbalizes Understanding      Exercise & Equipment Safety: - Individual verbal instruction and demonstration of equipment use and safety with use of the equipment.   Cardiac Rehab from 03/15/2018 in [facility] Cardiac and Pulmonary Rehab  Date  01/03/18  Educator  [facility]  Instruction Review Code  1- Verbalizes Understanding      Infection Prevention: - Provides verbal and written material to individual with discussion of infection control including proper hand washing and proper equipment cleaning during exercise session.   Cardiac Rehab from 03/15/2018 in [facility] Cardiac and Pulmonary Rehab  Date  01/03/18  Educator  [facility]  Instruction Review Code  1- Verbalizes Understanding      Falls Prevention: - Provides verbal and written material to individual with discussion of falls prevention and safety.   Cardiac Rehab from 03/15/2018 in [facility] Cardiac and Pulmonary Rehab  Date  01/03/18  Educator  [facility]  Instruction Review Code  1- Verbalizes Understanding  Diabetes: - Individual verbal and written instruction to review signs/symptoms of diabetes, desired ranges of glucose level fasting, after meals and with exercise. Acknowledge that pre and post exercise glucose checks will be done for 3 sessions at entry of program.   Cardiac Rehab from 03/15/2018 in [facility] Cardiac and Pulmonary Rehab  Date  01/03/18  Educator  SB  Instruction Review Code  1- Verbalizes Understanding      Know Your Numbers and Risk  Factors: -Group verbal and written instruction about important numbers in your health.  Discussion of what are risk factors and how they play a role in the disease process.  Review of Cholesterol, Blood Pressure, Diabetes, and BMI and the role they play in your overall health.   Sleep Hygiene: -Provides group verbal and written instruction about how sleep can affect your health.  Define sleep hygiene, discuss sleep cycles and impact of sleep habits. Review good sleep hygiene tips.    Cardiac Rehab from 03/15/2018 in [facility] Cardiac and Pulmonary Rehab  Date  03/08/18  Educator  [facility]  Instruction Review Code  1- Verbalizes Understanding      Other: -Provides group and verbal instruction on various topics (see comments)   Knowledge Questionnaire Score: Knowledge Questionnaire Score - 01/03/18 1055      Knowledge Questionnaire Score   Pre Score  22/26   test reviewed with pt today  Education Focuse: Angina, Nutrition      Core Components/Risk Factors/Patient Goals at Admission: Personal Goals and Risk Factors at Admission - 01/03/18 1403      Core Components/Risk Factors/Patient Goals on Admission    Weight Management  Yes;Weight Maintenance    Intervention  Weight Management: Develop a combined nutrition and exercise program designed to reach desired caloric intake, while maintaining appropriate intake of nutrient and fiber, sodium and fats, and appropriate energy expenditure required for the weight goal.;Weight Management: Provide education and appropriate resources to help participant work on and attain dietary goals.    Admit Weight  140 lb 9.6 oz (63.8 kg)    Expected Outcomes  Weight Maintenance: Understanding of the daily nutrition guidelines, which includes 25-35% calories from fat, 7% or less cal from saturated fats, less than '200mg'$  cholesterol, less than 1.5gm of sodium, & 5 or more servings of fruits and vegetables daily    Diabetes  Yes    Intervention  Provide education about  signs/symptoms and action to take for hypo/hyperglycemia.;Provide education about proper nutrition, including hydration, and aerobic/resistive exercise prescription along with prescribed medications to achieve blood glucose in normal ranges: Fasting glucose 65-99 mg/dL    Expected Outcomes  Short Term: Participant verbalizes understanding of the signs/symptoms and immediate care of hyper/hypoglycemia, proper foot care and importance of medication, aerobic/resistive exercise and nutrition plan for blood glucose control.;Long Term: Attainment of HbA1C < 7%.    Heart Failure  Yes    Intervention  Provide a combined exercise and nutrition program that is supplemented with education, support and counseling about heart failure. Directed toward relieving symptoms such as shortness of breath, decreased exercise tolerance, and extremity edema.    Expected Outcomes  Improve functional capacity of life;Short term: Attendance in program 2-3 days a week with increased exercise capacity. Reported lower sodium intake. Reported increased fruit and vegetable intake. Reports medication compliance.;Short term: Daily weights obtained and reported for increase. Utilizing diuretic protocols set by physician.;Long term: Adoption of self-care skills and reduction of barriers for early signs and symptoms recognition and intervention leading to self-care maintenance.  Hypertension  Yes    Intervention  Provide education on lifestyle modifcations including regular physical activity/exercise, weight management, moderate sodium restriction and increased consumption of fresh fruit, vegetables, and low fat dairy, alcohol moderation, and smoking cessation.;Monitor prescription use compliance.    Expected Outcomes  Short Term: Continued assessment and intervention until BP is < 140/44m HG in hypertensive participants. < 130/844mHG in hypertensive participants with diabetes, heart failure or chronic kidney disease.;Long Term: Maintenance  of blood pressure at goal levels.    Lipids  Yes    Intervention  Provide education and support for participant on nutrition & aerobic/resistive exercise along with prescribed medications to achieve LDL '70mg'$ , HDL >'40mg'$ .    Expected Outcomes  Short Term: Participant states understanding of desired cholesterol values and is compliant with medications prescribed. Participant is following exercise prescription and nutrition guidelines.;Long Term: Cholesterol controlled with medications as prescribed, with individualized exercise RX and with personalized nutrition plan. Value goals: LDL < '70mg'$ , HDL > 40 mg.       Core Components/Risk Factors/Patient Goals Review:  Goals and Risk Factor Review    Row Name 02/03/18 1036 03/01/18 1021           Core Components/Risk Factors/Patient Goals Review   Personal Goals Review  Weight Management/Obesity;Diabetes;Hypertension;Lipids  Weight Management/Obesity;Diabetes;Hypertension;Lipids      Review  CiJenny Reichmanns stuck with her weight, she is trying to get down to 118.  Today she was 137 lbs and has been steady.  We will start to increase her workloads to help with weight loss.  She is doing well with her blood sugars.  She does not check them very often at home.  Her lipids have been good.  She is borderline DM and her lipids recovered. Blood pressures have been good 120s/60s.  She does not check at home frequently.  She does have a cuff and uses it some with her son's help. She is going to start taking sugars and pressures some more at home to record to show to doctor.  Cindy see the endocrinologist tomorrow about diabetes. She is not checking them at home as her A1c is down to 5.3.  She is still not checking her blood pressures at home, but will talk to her sons about helping her with it.  She is doing well on her medications.  She has hit a plateau with her weight and hopes for the increase in exercise to help boost the weight loss.       Expected Outcomes  Short:  Take BP and blood sugars more frequently and record.  Long: Continue monitor risk factors.   Short: Start taking BP at home and see doctor tomorrow.  Long: Continue to work on weight loss.          Core Components/Risk Factors/Patient Goals at Discharge (Final Review):  Goals and Risk Factor Review - 03/01/18 1021      Core Components/Risk Factors/Patient Goals Review   Personal Goals Review  Weight Management/Obesity;Diabetes;Hypertension;Lipids    Review  CiJenny Reichmannee the endocrinologist tomorrow about diabetes. She is not checking them at home as her A1c is down to 5.3.  She is still not checking her blood pressures at home, but will talk to her sons about helping her with it.  She is doing well on her medications.  She has hit a plateau with her weight and hopes for the increase in exercise to help boost the weight loss.     Expected Outcomes  Short: Start taking  BP at home and see doctor tomorrow.  Long: Continue to work on weight loss.        ITP Comments: ITP Comments    Row Name 01/03/18 1344 01/19/18 0554 02/16/18 0841 03/16/18 0606     ITP Comments  Medical review completed today. ITP sent to Dr Loleta Chance for review, changes as needed and signature.  Documentation can be found in [facility] 12/28/2017  30 day review. Continue with ITP unless directed changes per Medical Director review.   New to program  30 day review completed. ITP sent to Dr. Ramonita Lab, covering for Dr. Emily Filbert, Medical Director of Cardiac Rehab. Continue with ITP unless changes are made by physician  30 day review completed. ITP sent to Dr. Emily Filbert, Medical Director of Cardiac Rehab. Continue with ITP unless changes are made by physician       Comments:

## 2018-03-17 ENCOUNTER — Encounter: Payer: Medicare Other | Admitting: *Deleted

## 2018-03-17 DIAGNOSIS — Z951 Presence of aortocoronary bypass graft: Secondary | ICD-10-CM | POA: Diagnosis not present

## 2018-03-17 NOTE — Progress Notes (Signed)
Daily Session Note  Patient Details  Name: Melanie Wolfe MRN: [id number] Date of Birth: 1946/03/11 Referring Provider:     Cardiac Rehab from 01/03/2018 in [facility] Cardiac and Pulmonary Rehab  Referring Provider  Cloretta Ned MD      Encounter Date: 03/17/2018  Check In: Session Check In - 03/17/18 0930      Check-In   Supervising physician immediately available to respond to emergencies  See telemetry face sheet for immediately available ER MD    Location  [facility]-Cardiac & Pulmonary Rehab    Staff Present  Alberteen Sam, MA, RCEP, CCRP, Exercise Physiologist;Carroll Enterkin, RN, Geralyn Corwin, RN BSN    Medication changes reported      No    Fall or balance concerns reported     No    Tobacco Cessation  No Change    Warm-up and Cool-down  Performed on first and last piece of equipment    Resistance Training Performed  Yes    VAD Patient?  No    PAD/SET Patient?  No      Pain Assessment   Currently in Pain?  No/denies    Multiple Pain Sites  No          Social History   Tobacco Use  Smoking Status Never Smoker  Smokeless Tobacco Never Used    Goals Met:  Independence with exercise equipment Exercise tolerated well No report of cardiac concerns or symptoms Strength training completed today  Goals Unmet:  Not Applicable  Comments: Pt able to follow exercise prescription today without complaint.  Will continue to monitor for progression.    Dr. Emily Filbert is Medical Director for [facility].

## 2018-03-22 ENCOUNTER — Encounter: Payer: Medicare Other | Admitting: *Deleted

## 2018-03-22 VITALS — Ht 63.4 in | Wt 135.0 lb

## 2018-03-22 DIAGNOSIS — Z951 Presence of aortocoronary bypass graft: Secondary | ICD-10-CM

## 2018-03-22 NOTE — Progress Notes (Signed)
Daily Session Note  Patient Details  Name: Melanie Wolfe MRN: [id number] Date of Birth: 05-07-1946 Referring Provider:     Cardiac Rehab from 01/03/2018 in [facility] Cardiac and Pulmonary Rehab  Referring Provider  Cloretta Ned MD      Encounter Date: 03/22/2018  Check In: Session Check In - 03/22/18 0926      Check-In   Supervising physician immediately available to respond to emergencies  See telemetry face sheet for immediately available ER MD    Location  [facility]-Cardiac & Pulmonary Rehab    Staff Present  Darel Hong, RN BSN;Jessica Luan Pulling, MA, RCEP, CCRP, Exercise Physiologist;Amanda Oletta Darter, [place], ACSM CEP, Exercise Physiologist    Medication changes reported      No    Fall or balance concerns reported     No    Warm-up and Cool-down  Performed on first and last piece of equipment    Resistance Training Performed  Yes    VAD Patient?  No    PAD/SET Patient?  No      Pain Assessment   Currently in Pain?  No/denies          Social History   Tobacco Use  Smoking Status Never Smoker  Smokeless Tobacco Never Used    Goals Met:  Independence with exercise equipment Exercise tolerated well No report of cardiac concerns or symptoms Strength training completed today  Goals Unmet:  Not Applicable  Comments: Pt able to follow exercise prescription today without complaint.  Will continue to monitor for progression.  [place] Name 01/03/18 1524 03/22/18 1347       6 Minute Walk   Phase  Initial  Discharge    Distance  1245 feet  1580 feet    Distance % Change  -  27 %    Distance Feet Change  -  335 ft    Walk Time  6 minutes  6 minutes    # of Rest Breaks  0  0    MPH  2.36  2.99    METS  2.97  3.75    RPE  11  12    VO2 Peak  10.41  13.13    Symptoms  No  No    Resting HR  73 bpm  69 bpm    Resting BP  126/64  118/70    Resting Oxygen Saturation   98 %  -    Exercise Oxygen Saturation  during 6 min walk  96 %  98 %    Max Ex. HR  93  bpm  109 bpm    Max Ex. BP  146/66  142/66    2 Minute Post BP  124/62  -        Dr. Emily Filbert is Medical Director for [place] and [facility].

## 2018-03-24 ENCOUNTER — Encounter: Payer: Medicare Other | Admitting: *Deleted

## 2018-03-24 DIAGNOSIS — Z951 Presence of aortocoronary bypass graft: Secondary | ICD-10-CM

## 2018-03-24 NOTE — Progress Notes (Signed)
Daily Session Note  Patient Details  Name: Melanie Wolfe MRN: [id number] Date of Birth: 11-03-1945 Referring Provider:     Cardiac Rehab from 01/03/2018 in [facility] Cardiac and Pulmonary Rehab  Referring Provider  Cloretta Ned MD      Encounter Date: 03/24/2018  Check In: Session Check In - 03/24/18 0926      Check-In   Supervising physician immediately available to respond to emergencies  See telemetry face sheet for immediately available ER MD    Location  [facility]-Cardiac & Pulmonary Rehab    Staff Present  Alberteen Sam, MA, RCEP, CCRP, Exercise Physiologist;Joseph Foy [place], BA, ACSM CEP, Exercise Physiologist;Carroll Enterkin, RN, BSN    Medication changes reported      No    Fall or balance concerns reported     No    Warm-up and Cool-down  Performed on first and last piece of equipment    Resistance Training Performed  Yes    VAD Patient?  No    PAD/SET Patient?  No      Pain Assessment   Currently in Pain?  No/denies          Social History   Tobacco Use  Smoking Status Never Smoker  Smokeless Tobacco Never Used    Goals Met:  Independence with exercise equipment Exercise tolerated well No report of cardiac concerns or symptoms Strength training completed today  Goals Unmet:  Not Applicable  Comments: Pt able to follow exercise prescription today without complaint.  Will continue to monitor for progression.    Dr. Emily Filbert is Medical Director for [place] and [facility].

## 2018-03-29 ENCOUNTER — Encounter: Payer: Medicare Other | Admitting: *Deleted

## 2018-03-29 DIAGNOSIS — Z951 Presence of aortocoronary bypass graft: Secondary | ICD-10-CM

## 2018-03-29 NOTE — Progress Notes (Signed)
Daily Session Note  Patient Details  Name: Melanie Wolfe MRN: [id number] Date of Birth: Jan 13, 1946 Referring Provider:     Cardiac Rehab from 01/03/2018 in [facility] Cardiac and Pulmonary Rehab  Referring Provider  Cloretta Ned MD      Encounter Date: 03/29/2018  Check In: Session Check In - 03/29/18 0915      Check-In   Supervising physician immediately available to respond to emergencies  See telemetry face sheet for immediately available ER MD    Location  [facility]-Cardiac & Pulmonary Rehab    Staff Present  Carson Myrtle, BS, RRT, Respiratory Lennie Hummer, MA, RCEP, CCRP, Exercise Physiologist;Joseph Foy [place], BA, ACSM CEP, Exercise Physiologist;Susanne Bice, RN, BSN, CCRP    Medication changes reported      No    Fall or balance concerns reported     No    Warm-up and Cool-down  Performed on first and last piece of equipment    Resistance Training Performed  Yes    VAD Patient?  No    PAD/SET Patient?  No      Pain Assessment   Currently in Pain?  No/denies          Social History   Tobacco Use  Smoking Status Never Smoker  Smokeless Tobacco Never Used    Goals Met:  Independence with exercise equipment Exercise tolerated well No report of cardiac concerns or symptoms Strength training completed today  Goals Unmet:  Not Applicable  Comments: Pt able to follow exercise prescription today without complaint.  Will continue to monitor for progression.    Dr. Emily Filbert is Medical Director for [place] and [facility].

## 2018-03-29 NOTE — Patient Instructions (Signed)
Discharge Patient Instructions  Patient Details  Name: Melanie Wolfe MRN: [id number] Date of Birth: Feb 17, 1946 Referring Provider:  Lolita Patella, MD   Number of Visits: 4  Reason for Discharge:  Patient reached a stable level of exercise. Patient has met program and personal goals.  Smoking History:  Social History   Tobacco Use  Smoking Status Never Smoker  Smokeless Tobacco Never Used    Diagnosis:  S/P CABG x 4  Initial Exercise Prescription: Initial Exercise Prescription - 01/03/18 1500      Date of Initial Exercise RX and Referring Provider   Date  01/03/18    Referring Provider  Cloretta Ned MD      Treadmill   MPH  2.3    Grade  0.5    Minutes  15    METs  2.92      NuStep   Level  1    SPM  80    Minutes  15    METs  2.5      Arm Ergometer   Level  2    Watts  33    RPM  25    Minutes  15    METs  2.5      Prescription Details   Frequency (times per week)  2    Duration  Progress to 30 minutes of continuous aerobic without signs/symptoms of physical distress      Intensity   THRR 40-80% of Max Heartrate  104-135    Ratings of Perceived Exertion  11-13    Perceived Dyspnea  0-4      Progression   Progression  Continue to progress workloads to maintain intensity without signs/symptoms of physical distress.      Resistance Training   Training Prescription  Yes    Weight  3 lbs       Discharge Exercise Prescription (Final Exercise Prescription Changes): Exercise Prescription Changes - 03/23/18 0900      Response to Exercise   Blood Pressure (Admit)  118/70    Blood Pressure (Exercise)  146/66    Blood Pressure (Exit)  112/60    Heart Rate (Admit)  57 bpm    Heart Rate (Exercise)  109 bpm    Heart Rate (Exit)  74 bpm    Rating of Perceived Exertion (Exercise)  13    Symptoms  none    Duration  Continue with 30 min of aerobic exercise without signs/symptoms of physical distress.    Intensity  THRR unchanged       Progression   Progression  Continue to progress workloads to maintain intensity without signs/symptoms of physical distress.    Average METs  2.16      Resistance Training   Training Prescription  Yes    Weight  3 lbs    Reps  10-15      Interval Training   Interval Training  No      Treadmill   MPH  2.2    Grade  0    Minutes  15    METs  2.68      Recumbant Bike   Level  2    Minutes  15      NuStep   Level  5    Minutes  15    METs  2.4      Arm Ergometer   Level  1    Minutes  15    METs  1.4      Home  Exercise Plan   Plans to continue exercise at  [facility] (comment)    Frequency  Add 3 additional days to program exercise sessions.    Initial Home Exercises Provided  02/03/18       Functional Capacity: 6 Minute Walk    Row Name 01/03/18 1524 03/22/18 1347       6 Minute Walk   Phase  Initial  Discharge    Distance  1245 feet  1580 feet    Distance % Change  -  27 %    Distance Feet Change  -  335 ft    Walk Time  6 minutes  6 minutes    # of Rest Breaks  0  0    MPH  2.36  2.99    METS  2.97  3.75    RPE  11  12    VO2 Peak  10.41  13.13    Symptoms  No  No    Resting HR  73 bpm  69 bpm    Resting BP  126/64  118/70    Resting Oxygen Saturation   98 %  -    Exercise Oxygen Saturation  during 6 min walk  96 %  98 %    Max Ex. HR  93 bpm  109 bpm    Max Ex. BP  146/66  142/66    2 Minute Post BP  124/62  -       Quality of Life: Quality of Life - 03/29/18 0957      Quality of Life   Select  Quality of Life      Quality of Life Scores   Health/Function Pre  19.23 %    Health/Function Post  26 %    Health/Function % Change  35.21 %    Socioeconomic Pre  17.63 %    Socioeconomic Post  25.92 %    Socioeconomic % Change   47.02 %    Psych/Spiritual Pre  24.43 %    Psych/Spiritual Post  28.5 %    Psych/Spiritual % Change  16.66 %    Family Pre  25.3 %    Family Post  28.5 %    Family % Change  12.65 %    GLOBAL Pre  20.77 %     GLOBAL Post  26.19 %    GLOBAL % Change  26.1 %       Personal Goals: Goals established at orientation with interventions provided to work toward goal. Personal Goals and Risk Factors at Admission - 01/03/18 1403      Core Components/Risk Factors/Patient Goals on Admission    Weight Management  Yes;Weight Maintenance    Intervention  Weight Management: Develop a combined nutrition and exercise program designed to reach desired caloric intake, while maintaining appropriate intake of nutrient and fiber, sodium and fats, and appropriate energy expenditure required for the weight goal.;Weight Management: Provide education and appropriate resources to help participant work on and attain dietary goals.    Admit Weight  140 lb 9.6 oz (63.8 kg)    Expected Outcomes  Weight Maintenance: Understanding of the daily nutrition guidelines, which includes 25-35% calories from fat, 7% or less cal from saturated fats, less than 220m cholesterol, less than 1.5gm of sodium, & 5 or more servings of fruits and vegetables daily    Diabetes  Yes    Intervention  Provide education about signs/symptoms and action to take for hypo/hyperglycemia.;Provide education about proper nutrition, including  hydration, and aerobic/resistive exercise prescription along with prescribed medications to achieve blood glucose in normal ranges: Fasting glucose 65-99 mg/dL    Expected Outcomes  Short Term: Participant verbalizes understanding of the signs/symptoms and immediate care of hyper/hypoglycemia, proper foot care and importance of medication, aerobic/resistive exercise and nutrition plan for blood glucose control.;Long Term: Attainment of HbA1C < 7%.    Heart Failure  Yes    Intervention  Provide a combined exercise and nutrition program that is supplemented with education, support and counseling about heart failure. Directed toward relieving symptoms such as shortness of breath, decreased exercise tolerance, and extremity edema.     Expected Outcomes  Improve functional capacity of life;Short term: Attendance in program 2-3 days a week with increased exercise capacity. Reported lower sodium intake. Reported increased fruit and vegetable intake. Reports medication compliance.;Short term: Daily weights obtained and reported for increase. Utilizing diuretic protocols set by physician.;Long term: Adoption of self-care skills and reduction of barriers for early signs and symptoms recognition and intervention leading to self-care maintenance.    Hypertension  Yes    Intervention  Provide education on lifestyle modifcations including regular physical activity/exercise, weight management, moderate sodium restriction and increased consumption of fresh fruit, vegetables, and low fat dairy, alcohol moderation, and smoking cessation.;Monitor prescription use compliance.    Expected Outcomes  Short Term: Continued assessment and intervention until BP is < 140/64m HG in hypertensive participants. < 130/869mHG in hypertensive participants with diabetes, heart failure or chronic kidney disease.;Long Term: Maintenance of blood pressure at goal levels.    Lipids  Yes    Intervention  Provide education and support for participant on nutrition & aerobic/resistive exercise along with prescribed medications to achieve LDL <7075mHDL >45m15m  Expected Outcomes  Short Term: Participant states understanding of desired cholesterol values and is compliant with medications prescribed. Participant is following exercise prescription and nutrition guidelines.;Long Term: Cholesterol controlled with medications as prescribed, with individualized exercise RX and with personalized nutrition plan. Value goals: LDL < 70mg81mL > 40 mg.        Personal Goals Discharge: Goals and Risk Factor Review - 03/01/18 1021      Core Components/Risk Factors/Patient Goals Review   Personal Goals Review  Weight Management/Obesity;Diabetes;Hypertension;Lipids    Review   CindyJenny Reichmannthe endocrinologist tomorrow about diabetes. She is not checking them at home as her A1c is down to 5.3.  She is still not checking her blood pressures at home, but will talk to her sons about helping her with it.  She is doing well on her medications.  She has hit a plateau with her weight and hopes for the increase in exercise to help boost the weight loss.     Expected Outcomes  Short: Start taking BP at home and see doctor tomorrow.  Long: Continue to work on weight loss.        Exercise Goals and Review: Exercise Goals    Row Name 01/03/18 1530             Exercise Goals   Increase Physical Activity  Yes       Intervention  Provide advice, education, support and counseling about physical activity/exercise needs.;Develop an individualized exercise prescription for aerobic and resistive training based on initial evaluation findings, risk stratification, comorbidities and participant's personal goals.       Expected Outcomes  Short Term: Attend rehab on a regular basis to increase amount of physical activity.;Long Term: Add in home exercise to  make exercise part of routine and to increase amount of physical activity.;Long Term: Exercising regularly at least 3-5 days a week.       Increase Strength and Stamina  Yes       Intervention  Develop an individualized exercise prescription for aerobic and resistive training based on initial evaluation findings, risk stratification, comorbidities and participant's personal goals.;Provide advice, education, support and counseling about physical activity/exercise needs.       Expected Outcomes  Short Term: Increase workloads from initial exercise prescription for resistance, speed, and METs.;Short Term: Perform resistance training exercises routinely during rehab and add in resistance training at home;Long Term: Improve cardiorespiratory fitness, muscular endurance and strength as measured by increased METs and functional capacity (6MWT)        Able to understand and use rate of perceived exertion (RPE) scale  Yes       Intervention  Provide education and explanation on how to use RPE scale       Expected Outcomes  Short Term: Able to use RPE daily in rehab to express subjective intensity level;Long Term:  Able to use RPE to guide intensity level when exercising independently       Knowledge and understanding of Target Heart Rate Range (THRR)  Yes       Intervention  Provide education and explanation of THRR including how the numbers were predicted and where they are located for reference       Expected Outcomes  Short Term: Able to use daily as guideline for intensity in rehab;Short Term: Able to state/look up THRR;Long Term: Able to use THRR to govern intensity when exercising independently       Able to check pulse independently  Yes       Intervention  Provide education and demonstration on how to check pulse in carotid and radial arteries.;Review the importance of being able to check your own pulse for safety during independent exercise       Expected Outcomes  Short Term: Able to explain why pulse checking is important during independent exercise;Long Term: Able to check pulse independently and accurately       Understanding of Exercise Prescription  Yes       Intervention  Provide education, explanation, and written materials on patient's individual exercise prescription       Expected Outcomes  Short Term: Able to explain program exercise prescription;Long Term: Able to explain home exercise prescription to exercise independently          Nutrition & Weight - Outcomes: Pre Biometrics - 01/03/18 1530      Pre Biometrics   Height  5' 3.4" (1.61 m)    Weight  140 lb 9.6 oz (63.8 kg)    Waist Circumference  32.25 inches    Hip Circumference  38.75 inches    Waist to Hip Ratio  0.83 %    BMI (Calculated)  24.6    Single Leg Stand  30 seconds      Post Biometrics - 03/22/18 1353       Post  Biometrics   Height  5' 3.4"  (1.61 m)    Weight  135 lb (61.2 kg)    Waist Circumference  32 inches    Hip Circumference  37.25 inches    Waist to Hip Ratio  0.86 %    BMI (Calculated)  23.62    Single Leg Stand  30 seconds       Nutrition: Nutrition Therapy & Goals - 02/15/18 1208  Nutrition Therapy   Diet  TLC   follows a vegan diet   Drug/Food Interactions  Statins/Certain Fruits    Protein (specify units)  6-7    Fiber  25 grams    Whole Grain Foods  3 servings   chooses whole grains regularly   Saturated Fats  11 max. grams    Fruits and Vegetables  8 servings/day    Sodium  1500 grams      Personal Nutrition Goals   Nutrition Goal  Increase your dietary intake of unsaturated fats, which will improve your HDL. You can incorporate them by adding them to smoothies, eating them as snacks, adding them on to salads, etc.    Personal Goal #2  Aim to eat a variety of plant-based protein sources each day so that your body gets all of the amino acids it needs to build complete proteins and to prevent nutritional deficiencies    Personal Goal #3  Prioritize vegan sources of Vitamin B12 such as nutritional yeast    Comments  She has started to incorporate physical activity in addtion to following a vegan diet (with the exception of salmon once every 2 weeks) which has helped her improve her HgbA1c and reduce LDL. Her HDL could still be improved. Does not salt food and eats a high fiber diet      Intervention Plan   Intervention  Prescribe, educate and counsel regarding individualized specific dietary modifications aiming towards targeted core components such as weight, hypertension, lipid management, diabetes, heart failure and other comorbidities.    Expected Outcomes  Short Term Goal: A plan has been developed with personal nutrition goals set during dietitian appointment.;Long Term Goal: Adherence to prescribed nutrition plan.;Short Term Goal: Understand basic principles of dietary content, such as calories,  fat, sodium, cholesterol and nutrients.       Nutrition Discharge: Nutrition Assessments - 03/29/18 0957      MEDFICTS Scores   Pre Score  7    Post Score  0    Score Difference  -7       Education Questionnaire Score: Knowledge Questionnaire Score - 03/29/18 0957      Knowledge Questionnaire Score   Pre Score  22/26    Post Score  25/26   test reviewed with pt today      Goals reviewed with patient; copy given to patient.

## 2018-03-31 DIAGNOSIS — Z951 Presence of aortocoronary bypass graft: Secondary | ICD-10-CM | POA: Diagnosis not present

## 2018-03-31 NOTE — Progress Notes (Signed)
Discharge Progress Report  Patient Details  Name: Melanie Wolfe MRN: [id number] Date of Birth: 20-Sep-1945 Referring Provider:     Cardiac Rehab from 01/03/2018 in [facility]  Referring Provider  Melanie Ned MD       Number of Visits: 36/36  Reason for Discharge:  Patient reached a stable level of exercise. Patient independent in their exercise. Patient has met program and personal goals.  Smoking History:  Social History   Tobacco Use  Smoking Status Never Smoker  Smokeless Tobacco Never Used    Diagnosis:  S/P CABG x 4  ADL UCSD:   Initial Exercise Prescription: Initial Exercise Prescription - 01/03/18 1500      Date of Initial Exercise RX and Referring Provider   Date  01/03/18    Referring Provider  Melanie Ned MD      Treadmill   MPH  2.3    Grade  0.5    Minutes  15    METs  2.92      NuStep   Level  1    SPM  80    Minutes  15    METs  2.5      Arm Ergometer   Level  2    Watts  33    RPM  25    Minutes  15    METs  2.5      Prescription Details   Frequency (times per week)  2    Duration  Progress to 30 minutes of continuous aerobic without signs/symptoms of physical distress      Intensity   THRR 40-80% of Max Heartrate  104-135    Ratings of Perceived Exertion  11-13    Perceived Dyspnea  0-4      Progression   Progression  Continue to progress workloads to maintain intensity without signs/symptoms of physical distress.      Resistance Training   Training Prescription  Yes    Weight  3 lbs       Discharge Exercise Prescription (Final Exercise Prescription Changes): Exercise Prescription Changes - 03/23/18 0900      Response to Exercise   Blood Pressure (Admit)  118/70    Blood Pressure (Exercise)  146/66    Blood Pressure (Exit)  112/60    Heart Rate (Admit)  57 bpm    Heart Rate (Exercise)  109 bpm    Heart Rate (Exit)  74 bpm    Rating of Perceived Exertion (Exercise)  13    Symptoms   none    Duration  Continue with 30 min of aerobic exercise without signs/symptoms of physical distress.    Intensity  THRR unchanged      Progression   Progression  Continue to progress workloads to maintain intensity without signs/symptoms of physical distress.    Average METs  2.16      Resistance Training   Training Prescription  Yes    Weight  3 lbs    Reps  10-15      Interval Training   Interval Training  No      Treadmill   MPH  2.2    Grade  0    Minutes  15    METs  2.68      Recumbant Bike   Level  2    Minutes  15      NuStep   Level  5    Minutes  15    METs  2.4  Arm Ergometer   Level  1    Minutes  15    METs  1.4      Home Exercise Plan   Plans to continue exercise at  [facility] (comment)    Frequency  Add 3 additional days to program exercise sessions.    Initial Home Exercises Provided  02/03/18       Functional Capacity: 6 Minute Walk    Row Name 01/03/18 1524 03/22/18 1347       6 Minute Walk   Phase  Initial  Discharge    Distance  1245 feet  1580 feet    Distance % Change  -  27 %    Distance Feet Change  -  335 ft    Walk Time  6 minutes  6 minutes    # of Rest Breaks  0  0    MPH  2.36  2.99    METS  2.97  3.75    RPE  11  12    VO2 Peak  10.41  13.13    Symptoms  No  No    Resting HR  73 bpm  69 bpm    Resting BP  126/64  118/70    Resting Oxygen Saturation   98 %  -    Exercise Oxygen Saturation  during 6 min walk  96 %  98 %    Max Ex. HR  93 bpm  109 bpm    Max Ex. BP  146/66  142/66    2 Minute Post BP  124/62  -       Psychological, QOL, Others - Outcomes: PHQ 2/9: Depression screen [name] 2/9 03/29/2018 01/03/2018  Decreased Interest 0 0  Down, Depressed, Hopeless 0 0  PHQ - 2 Score 0 0  Altered sleeping 1 1  Tired, decreased energy 1 0  Change in appetite 1 0  Feeling bad or failure about yourself  0 0  Trouble concentrating 0 0  Moving slowly or fidgety/restless 0 0  Suicidal thoughts 0 0  PHQ-9  Score 3 1  Difficult doing work/chores Not difficult at all Not difficult at all    Quality of Life: Quality of Life - 03/29/18 0957      Quality of Life   Select  Quality of Life      Quality of Life Scores   Health/Function Pre  19.23 %    Health/Function Post  26 %    Health/Function % Change  35.21 %    Socioeconomic Pre  17.63 %    Socioeconomic Post  25.92 %    Socioeconomic % Change   47.02 %    Psych/Spiritual Pre  24.43 %    Psych/Spiritual Post  28.5 %    Psych/Spiritual % Change  16.66 %    Family Pre  25.3 %    Family Post  28.5 %    Family % Change  12.65 %    GLOBAL Pre  20.77 %    GLOBAL Post  26.19 %    GLOBAL % Change  26.1 %       Personal Goals: Goals established at orientation with interventions provided to work toward goal. Personal Goals and Risk Factors at Admission - 01/03/18 1403      Core Components/Risk Factors/Patient Goals on Admission    Weight Management  Yes;Weight Maintenance    Intervention  Weight Management: Develop a combined nutrition and exercise program designed to reach desired caloric  intake, while maintaining appropriate intake of nutrient and fiber, sodium and fats, and appropriate energy expenditure required for the weight goal.;Weight Management: Provide education and appropriate resources to help participant work on and attain dietary goals.    Admit Weight  140 lb 9.6 oz (63.8 kg)    Expected Outcomes  Weight Maintenance: Understanding of the daily nutrition guidelines, which includes 25-35% calories from fat, 7% or less cal from saturated fats, less than 256m cholesterol, less than 1.5gm of sodium, & 5 or more servings of fruits and vegetables daily    Diabetes  Yes    Intervention  Provide education about signs/symptoms and action to take for hypo/hyperglycemia.;Provide education about proper nutrition, including hydration, and aerobic/resistive exercise prescription along with prescribed medications to achieve blood glucose in  normal ranges: Fasting glucose 65-99 mg/dL    Expected Outcomes  Short Term: Participant verbalizes understanding of the signs/symptoms and immediate care of hyper/hypoglycemia, proper foot care and importance of medication, aerobic/resistive exercise and nutrition plan for blood glucose control.;Long Term: Attainment of HbA1C < 7%.    Heart Failure  Yes    Intervention  Provide a combined exercise and nutrition program that is supplemented with education, support and counseling about heart failure. Directed toward relieving symptoms such as shortness of breath, decreased exercise tolerance, and extremity edema.    Expected Outcomes  Improve functional capacity of life;Short term: Attendance in program 2-3 days a week with increased exercise capacity. Reported lower sodium intake. Reported increased fruit and vegetable intake. Reports medication compliance.;Short term: Daily weights obtained and reported for increase. Utilizing diuretic protocols set by physician.;Long term: Adoption of self-care skills and reduction of barriers for early signs and symptoms recognition and intervention leading to self-care maintenance.    Hypertension  Yes    Intervention  Provide education on lifestyle modifcations including regular physical activity/exercise, weight management, moderate sodium restriction and increased consumption of fresh fruit, vegetables, and low fat dairy, alcohol moderation, and smoking cessation.;Monitor prescription use compliance.    Expected Outcomes  Short Term: Continued assessment and intervention until BP is < 140/947mHG in hypertensive participants. < 130/8043mG in hypertensive participants with diabetes, heart failure or chronic kidney disease.;Long Term: Maintenance of blood pressure at goal levels.    Lipids  Yes    Intervention  Provide education and support for participant on nutrition & aerobic/resistive exercise along with prescribed medications to achieve LDL <64m31mDL >40mg31m  Expected Outcomes  Short Term: Participant states understanding of desired cholesterol values and is compliant with medications prescribed. Participant is following exercise prescription and nutrition guidelines.;Long Term: Cholesterol controlled with medications as prescribed, with individualized exercise RX and with personalized nutrition plan. Value goals: LDL < 64mg,41m > 40 mg.        Personal Goals Discharge: Goals and Risk Factor Review    Row Name 02/03/18 1036 03/01/18 1021           Core Components/Risk Factors/Patient Goals Review   Personal Goals Review  Weight Management/Obesity;Diabetes;Hypertension;Lipids  Weight Management/Obesity;Diabetes;Hypertension;Lipids      Review  Cindy Jenny Reichmannuck with her weight, she is trying to get down to 118.  Today she was 137 lbs and has been steady.  We will start to increase her workloads to help with weight loss.  She is doing well with her blood sugars.  She does not check them very often at home.  Her lipids have been good.  She is borderline DM and her lipids recovered.  Blood pressures have been good 120s/60s.  She does not check at home frequently.  She does have a cuff and uses it some with her son's help. She is going to start taking sugars and pressures some more at home to record to show to doctor.  Cindy see the endocrinologist tomorrow about diabetes. She is not checking them at home as her A1c is down to 5.3.  She is still not checking her blood pressures at home, but will talk to her sons about helping her with it.  She is doing well on her medications.  She has hit a plateau with her weight and hopes for the increase in exercise to help boost the weight loss.       Expected Outcomes  Short: Take BP and blood sugars more frequently and record.  Long: Continue monitor risk factors.   Short: Start taking BP at home and see doctor tomorrow.  Long: Continue to work on weight loss.          Exercise Goals and Review: Exercise Goals    Row  Name 01/03/18 1530             Exercise Goals   Increase Physical Activity  Yes       Intervention  Provide advice, education, support and counseling about physical activity/exercise needs.;Develop an individualized exercise prescription for aerobic and resistive training based on initial evaluation findings, risk stratification, comorbidities and participant's personal goals.       Expected Outcomes  Short Term: Attend rehab on a regular basis to increase amount of physical activity.;Long Term: Add in home exercise to make exercise part of routine and to increase amount of physical activity.;Long Term: Exercising regularly at least 3-5 days a week.       Increase Strength and Stamina  Yes       Intervention  Develop an individualized exercise prescription for aerobic and resistive training based on initial evaluation findings, risk stratification, comorbidities and participant's personal goals.;Provide advice, education, support and counseling about physical activity/exercise needs.       Expected Outcomes  Short Term: Increase workloads from initial exercise prescription for resistance, speed, and METs.;Short Term: Perform resistance training exercises routinely during rehab and add in resistance training at home;Long Term: Improve cardiorespiratory fitness, muscular endurance and strength as measured by increased METs and functional capacity (6MWT)       Able to understand and use rate of perceived exertion (RPE) scale  Yes       Intervention  Provide education and explanation on how to use RPE scale       Expected Outcomes  Short Term: Able to use RPE daily in rehab to express subjective intensity level;Long Term:  Able to use RPE to guide intensity level when exercising independently       Knowledge and understanding of Target Heart Rate Range (THRR)  Yes       Intervention  Provide education and explanation of THRR including how the numbers were predicted and where they are located for  reference       Expected Outcomes  Short Term: Able to use daily as guideline for intensity in rehab;Short Term: Able to state/look up THRR;Long Term: Able to use THRR to govern intensity when exercising independently       Able to check pulse independently  Yes       Intervention  Provide education and demonstration on how to check pulse in carotid and radial arteries.;Review the importance of being able  to check your own pulse for safety during independent exercise       Expected Outcomes  Short Term: Able to explain why pulse checking is important during independent exercise;Long Term: Able to check pulse independently and accurately       Understanding of Exercise Prescription  Yes       Intervention  Provide education, explanation, and written materials on patient's individual exercise prescription       Expected Outcomes  Short Term: Able to explain program exercise prescription;Long Term: Able to explain home exercise prescription to exercise independently          Nutrition & Weight - Outcomes: Pre Biometrics - 01/03/18 1530      Pre Biometrics   Height  5' 3.4" (1.61 m)    Weight  140 lb 9.6 oz (63.8 kg)    Waist Circumference  32.25 inches    Hip Circumference  38.75 inches    Waist to Hip Ratio  0.83 %    BMI (Calculated)  24.6    Single Leg Stand  30 seconds      Post Biometrics - 03/22/18 1353       Post  Biometrics   Height  5' 3.4" (1.61 m)    Weight  135 lb (61.2 kg)    Waist Circumference  32 inches    Hip Circumference  37.25 inches    Waist to Hip Ratio  0.86 %    BMI (Calculated)  23.62    Single Leg Stand  30 seconds       Nutrition: Nutrition Therapy & Goals - 02/15/18 1208      Nutrition Therapy   Diet  TLC   follows a vegan diet   Drug/Food Interactions  Statins/Certain Fruits    Protein (specify units)  6-7    Fiber  25 grams    Whole Grain Foods  3 servings   chooses whole grains regularly   Saturated Fats  11 max. grams    Fruits and  Vegetables  8 servings/day    Sodium  1500 grams      Personal Nutrition Goals   Nutrition Goal  Increase your dietary intake of unsaturated fats, which will improve your HDL. You can incorporate them by adding them to smoothies, eating them as snacks, adding them on to salads, etc.    Personal Goal #2  Aim to eat a variety of plant-based protein sources each day so that your body gets all of the amino acids it needs to build complete proteins and to prevent nutritional deficiencies    Personal Goal #3  Prioritize vegan sources of Vitamin B12 such as nutritional yeast    Comments  She has started to incorporate physical activity in addtion to following a vegan diet (with the exception of salmon once every 2 weeks) which has helped her improve her HgbA1c and reduce LDL. Her HDL could still be improved. Does not salt food and eats a high fiber diet      Intervention Plan   Intervention  Prescribe, educate and counsel regarding individualized specific dietary modifications aiming towards targeted core components such as weight, hypertension, lipid management, diabetes, heart failure and other comorbidities.    Expected Outcomes  Short Term Goal: A plan has been developed with personal nutrition goals set during dietitian appointment.;Long Term Goal: Adherence to prescribed nutrition plan.;Short Term Goal: Understand basic principles of dietary content, such as calories, fat, sodium, cholesterol and nutrients.       Nutrition  Discharge: Nutrition Assessments - 03/29/18 0957      MEDFICTS Scores   Pre Score  7    Post Score  0    Score Difference  -7       Education Questionnaire Score: Knowledge Questionnaire Score - 03/29/18 0957      Knowledge Questionnaire Score   Pre Score  22/26    Post Score  25/26   test reviewed with pt today      Goals reviewed with patient; copy given to patient.

## 2018-03-31 NOTE — Progress Notes (Signed)
Daily Session Note  Patient Details  Name: Melanie Wolfe MRN: [id number] Date of Birth: 11/05/1945 Referring Provider:     Cardiac Rehab from 01/03/2018 in [facility] Re[facility]  Referring Provider  Cloretta Ned MD      Encounter Date: 03/31/2018  Check In: Session Check In - 03/31/18 0924      Check-In   Supervising physician immediately available to respond to emergencies  See telemetry face sheet for immediately available ER MD    Location  [facility]-Cardiac & Pulmonary Rehab    Staff Present  Alberteen Sam, MA, RCEP, CCRP, Exercise Physiologist;Airam Runions RCP,RRT,BSRT;Carroll Enterkin, RN, BSN;Leslie Psychologist, prison and probation services, BSN    Medication changes reported      No    Fall or balance concerns reported     No    Warm-up and Cool-down  Performed on first and last piece of Teacher, music Performed  Yes    VAD Patient?  No    PAD/SET Patient?  No      Pain Assessment   Currently in Pain?  No/denies          Social History   Tobacco Use  Smoking Status Never Smoker  Smokeless Tobacco Never Used    Goals Met:  Independence with exercise equipment Exercise tolerated well No report of cardiac concerns or symptoms Strength training completed today  Goals Unmet:  Not Applicable  Comments:  Rainah graduated today from  rehab with 36 sessions completed.  Details of the patient's exercise prescription and what She needs to do in order to continue the prescription and progress were discussed with patient.  Patient was given a copy of prescription and goals.  Patient verbalized understanding.  Jandy plans to continue to exercise by walking at home.    Dr. Emily Filbert is Medical Director for [place] and [facility].

## 2018-03-31 NOTE — Progress Notes (Signed)
Cardiac Individual Treatment Plan  Patient Details   Name: Melanie Wolfe MRN: [id number] Date of Birth: [birth year] Referring Provider:     Cardiac Rehab from 01/03/2018 in [facility] Cardiac [facility] Rehab  Referring Provider  Cloretta Ned MD      Initial Encounter Date:    Cardiac Rehab from 01/03/2018 in [facility] Cardiac and Pulmonary Rehab  Date  01/03/18      Visit Diagnosis: S/P CABG x 4  Patient's Home Medications on Admission:  Current Outpatient Medications:  .  acetaminophen (TYLENOL) 325 MG tablet, Take 650 mg by mouth every 6 (six) hours as needed., Disp: , Rfl:  .  apixaban (ELIQUIS) 5 MG TABS tablet, Take 5 mg by mouth every 12 (twelve) hours., Disp: , Rfl:  .  aspirin EC 81 MG tablet, Take 81 mg by mouth daily., Disp: , Rfl:  .  atorvastatin (LIPITOR) 40 MG tablet, Take 40 mg by mouth daily., Disp: , Rfl:  .  ezetimibe (ZETIA) 10 MG tablet, Take by mouth., Disp: , Rfl:  .  lisinopril (PRINIVIL,ZESTRIL) 2.5 MG tablet, Take 2.5 mg by mouth daily., Disp: , Rfl:  .  metFORMIN (GLUCOPHAGE) 500 MG tablet, Take 500 mg by mouth 2 (two) times daily with a meal., Disp: , Rfl:  .  metoprolol succinate (TOPROL-XL) 50 MG 24 hr tablet, Take 50 mg by mouth every 12 (twelve) hours. Take with or immediately following a meal., Disp: , Rfl:  .  pantoprazole (PROTONIX) 40 MG tablet, Take 40 mg by mouth daily., Disp: , Rfl:  .  traMADol (ULTRAM) 50 MG tablet, Take 0.5 tablets (25 mg total) by mouth every 6 (six) hours as needed. 1/2 tab (Patient not taking: Reported on 01/03/2018), Disp: 30 tablet, Rfl: 0  Past Medical History: Past Medical History:  Diagnosis Date  . Chronic diastolic heart failure ([place])   . Coronary artery disease due to lipid rich plaque   . DM II (diabetes mellitus, type II), controlled ([place])   . HLD (hyperlipidemia)   . HTN (hypertension)   . NSTEMI (non-ST elevated myocardial infarction) ([place])   . STEMI (ST elevation myocardial infarction) ([place]) 2004   s/p  PCI to LAD    Tobacco Use: Social History   Tobacco Use  Smoking Status Never Smoker  Smokeless Tobacco Never Used    Labs: Recent Review Flowsheet Data    There is no flowsheet data to display.       Exercise Target Goals: Exercise Program Goal: Individual exercise prescription set using results from initial 6 min walk test and THRR while considering  patient's activity barriers and safety.   Exercise Prescription Goal: Initial exercise prescription builds to 30-45 minutes a day of aerobic activity, 2-3 days per week.  Home exercise guidelines will be given to patient during program as part of exercise prescription that the participant will acknowledge.  Activity Barriers & Risk Stratification: Activity Barriers & Cardiac Risk Stratification - 01/03/18 1401      Activity Barriers & Cardiac Risk Stratification   Activity Barriers  Deconditioning;Muscular Weakness    Cardiac Risk Stratification  High   History of MI      6 Minute Walk: 6 Minute Walk    Row Name 01/03/18 1524 03/22/18 1347       6 Minute Walk   Phase  Initial  Discharge    Distance  1245 feet  1580 feet    Distance % Change  -  27 %    Distance Feet Change  -  335 ft    Walk Time  6 minutes  6 minutes    # of Rest Breaks  0  0    MPH  2.36  2.99    METS  2.97  3.75    RPE  11  12    VO2 Peak  10.41  13.13    Symptoms  No  No    Resting HR  73 bpm  69 bpm    Resting BP  126/64  118/70    Resting Oxygen Saturation   98 %  -    Exercise Oxygen Saturation  during 6 min walk  96 %  98 %    Max Ex. HR  93 bpm  109 bpm    Max Ex. BP  146/66  142/66    2 Minute Post BP  124/62  -       Oxygen Initial Assessment:   Oxygen Re-Evaluation:   Oxygen Discharge (Final Oxygen Re-Evaluation):   Initial Exercise Prescription: Initial Exercise Prescription - 01/03/18 1500      Date of Initial Exercise RX and Referring Provider   Date  01/03/18    Referring Provider  Cloretta Ned MD       Treadmill   MPH  2.3    Grade  0.5    Minutes  15    METs  2.92      NuStep   Level  1    SPM  80    Minutes  15    METs  2.5      Arm Ergometer   Level  2    Watts  33    RPM  25    Minutes  15    METs  2.5      Prescription Details   Frequency (times per week)  2    Duration  Progress to 30 minutes of continuous aerobic without signs/symptoms of physical distress      Intensity   THRR 40-80% of Max Heartrate  104-135    Ratings of Perceived Exertion  11-13    Perceived Dyspnea  0-4      Progression   Progression  Continue to progress workloads to maintain intensity without signs/symptoms of physical distress.      Resistance Training   Training Prescription  Yes    Weight  3 lbs       Perform Capillary Blood Glucose checks as needed.  Exercise Prescription Changes: Exercise Prescription Changes    Row Name 01/03/18 1400 01/26/18 1500 02/03/18 1000 02/10/18 1100 02/22/18 1200     Response to Exercise   Blood Pressure (Admit)  126/64  106/68  -  126/64  126/64   Blood Pressure (Exercise)  146/66  132/58  -  140/64  132/72   Blood Pressure (Exit)  124/62  122/64  -  124/62  128/64   Heart Rate (Admit)  73 bpm  75 bpm  -  81 bpm  67 bpm   Heart Rate (Exercise)  98 bpm  85 bpm  -  98 bpm  97 bpm   Heart Rate (Exit)  80 bpm  71 bpm  -  72 bpm  71 bpm   Oxygen Saturation (Admit)  98 %  -  -  -  -   Oxygen Saturation (Exercise)  96 %  -  -  -  -   Rating of Perceived Exertion (Exercise)  11  14  -  13  13  Symptoms  none  none  -  none  none   Comments  walk test results  second full day of exercise  -  -  -   Duration  -  Continue with 30 min of aerobic exercise without signs/symptoms of physical distress.  -  Continue with 30 min of aerobic exercise without signs/symptoms of physical distress.  Continue with 30 min of aerobic exercise without signs/symptoms of physical distress.   Intensity  -  THRR unchanged  -  THRR unchanged  THRR unchanged     Progression    Progression  -  Continue to progress workloads to maintain intensity without signs/symptoms of physical distress.  -  Continue to progress workloads to maintain intensity without signs/symptoms of physical distress.  Continue to progress workloads to maintain intensity without signs/symptoms of physical distress.   Average METs  -  1.96  -  1.34  1.34     Resistance Training   Training Prescription  -  Yes  -  Yes  Yes   Weight  -  3 lbs  -  3 lbs  3 lbs   Reps  -  10-15  -  10-15  10-15     Interval Training   Interval Training  -  No  -  No  -     Treadmill   MPH  -  2.2  -  2.2  2.2   Grade  -  0  -  0  0   Minutes  -  15  -  15  15   METs  -  2.68  -  2.68  2.68     Recumbant Bike   Level  -  -  -  2  -   Watts  -  -  -  25  -   Minutes  -  -  -  15  -     NuStep   Level  -  1  -  -  3   Minutes  -  15  -  -  15   METs  -  1.9  -  -  1.9     Arm Ergometer   Level  -  2  -  -  -   Minutes  -  15  -  -  -   METs  -  1.9  -  -  -     Home Exercise Plan   Plans to continue exercise at  -  -  [employer] (comment) walking, [employer] (comment)  Forensic scientist (comment)   Frequency  -  -  Add 3 additional days to program exercise sessions.  Add 3 additional days to program exercise sessions.  Add 3 additional days to program exercise sessions.   Initial Home Exercises Provided  -  -  02/03/18  02/03/18  02/03/18   Row Name 03/01/18 1000 03/08/18 1600 03/23/18 0900         Response to Exercise   Blood Pressure (Admit)  -  128/62  118/70     Blood Pressure (Exercise)  -  112/68  146/66     Blood Pressure (Exit)  -  130/72  112/60     Heart Rate (Admit)  -  77 bpm  57 bpm     Heart Rate (Exercise)  -  104 bpm  109 bpm     Heart Rate (Exit)  -  63 bpm  74 bpm     Rating of Perceived Exertion (Exercise)  -  12  13     Symptoms  -  none  none     Duration  -  Continue with 30 min of aerobic exercise without signs/symptoms of physical distress.   Continue with 30 min of aerobic exercise without signs/symptoms of physical distress.     Intensity  -  THRR unchanged  THRR unchanged       Progression   Progression  -  Continue to progress workloads to maintain intensity without signs/symptoms of physical distress.  Continue to progress workloads to maintain intensity without signs/symptoms of physical distress.     Average METs  -  2.78  2.16       Resistance Training   Training Prescription  -  Yes  Yes     Weight  -  3 lbs  3 lbs     Reps  -  10-15  10-15       Interval Training   Interval Training  -  No  No       Treadmill   MPH  -  2.2  2.2     Grade  -  0  0     Minutes  -  15  15     METs  -  2.68  2.68       Recumbant Bike   Level  -  2  2     Watts  -  18  -     Minutes  -  15  15     METs  -  3.08  -       NuStep   Level  -  3  5     Minutes  -  15  15     METs  -  2.5  2.4       Arm Ergometer   Level  -  -  1     Minutes  -  -  15     METs  -  -  1.4       Home Exercise Plan   Plans to continue exercise at  [employer] (comment)  Forensic scientist (comment)  Forensic scientist (comment)     Frequency  Add 3 additional days to program exercise sessions.  Add 3 additional days to program exercise sessions.  Add 3 additional days to program exercise sessions.     Initial Home Exercises Provided  02/03/18  02/03/18  02/03/18        Exercise Comments: Exercise Comments    Row Name 01/11/18 1021 03/31/18 0925         Exercise Comments  First full day of exercise!  Patient was oriented to gym and equipment including functions, settings, policies, and procedures.  Patient's individual exercise prescription and treatment plan were reviewed.  All starting workloads were established based on the results of the 6 minute walk test done at initial orientation visit.  The plan for exercise progression was also introduced and progression will be customized based on patient's performance and goals.  Melanie Wolfe  graduated today from  rehab with 36 sessions completed.  Details of the patient's exercise prescription and what She needs to do in order to continue the prescription and progress were discussed with patient.  Patient was given a copy of prescription and goals.  Patient verbalized understanding.  Melanie Wolfe plans to continue to exercise by walking at home.  Exercise Goals and Review: Exercise Goals    Row Name 01/03/18 1530             Exercise Goals   Increase Physical Activity  Yes       Intervention  Provide advice, education, support and counseling about physical activity/exercise needs.;Develop an individualized exercise prescription for aerobic and resistive training based on initial evaluation findings, risk stratification, comorbidities and participant's personal goals.       Expected Outcomes  Short Term: Attend rehab on a regular basis to increase amount of physical activity.;Long Term: Add in home exercise to make exercise part of routine and to increase amount of physical activity.;Long Term: Exercising regularly at least 3-5 days a week.       Increase Strength and Stamina  Yes       Intervention  Develop an individualized exercise prescription for aerobic and resistive training based on initial evaluation findings, risk stratification, comorbidities and participant's personal goals.;Provide advice, education, support and counseling about physical activity/exercise needs.       Expected Outcomes  Short Term: Increase workloads from initial exercise prescription for resistance, speed, and METs.;Short Term: Perform resistance training exercises routinely during rehab and add in resistance training at home;Long Term: Improve cardiorespiratory fitness, muscular endurance and strength as measured by increased METs and functional capacity (6MWT)       Able to understand and use rate of perceived exertion (RPE) scale  Yes       Intervention  Provide education and explanation on how to  use RPE scale       Expected Outcomes  Short Term: Able to use RPE daily in rehab to express subjective intensity level;Long Term:  Able to use RPE to guide intensity level when exercising independently       Knowledge and understanding of Target Heart Rate Range (THRR)  Yes       Intervention  Provide education and explanation of THRR including how the numbers were predicted and where they are located for reference       Expected Outcomes  Short Term: Able to use daily as guideline for intensity in rehab;Short Term: Able to state/look up THRR;Long Term: Able to use THRR to govern intensity when exercising independently       Able to check pulse independently  Yes       Intervention  Provide education and demonstration on how to check pulse in carotid and radial arteries.;Review the importance of being able to check your own pulse for safety during independent exercise       Expected Outcomes  Short Term: Able to explain why pulse checking is important during independent exercise;Long Term: Able to check pulse independently and accurately       Understanding of Exercise Prescription  Yes       Intervention  Provide education, explanation, and written materials on patient's individual exercise prescription       Expected Outcomes  Short Term: Able to explain program exercise prescription;Long Term: Able to explain home exercise prescription to exercise independently          Exercise Goals Re-Evaluation : Exercise Goals Re-Evaluation    Row Name 01/11/18 1022 01/26/18 1526 02/03/18 1015 02/10/18 1154 02/22/18 1230     Exercise Goal Re-Evaluation   Exercise Goals Review  Understanding of Exercise Prescription;Knowledge and understanding of Target Heart Rate Range (THRR);Able to understand and use rate of perceived exertion (RPE) scale  Increase Physical Activity;Understanding of Exercise Prescription;Increase Strength and Stamina  Increase  Physical Activity;Understanding of Exercise  Prescription;Increase Strength and Stamina;Able to understand and use rate of perceived exertion (RPE) scale;Knowledge and understanding of Target Heart Rate Range (THRR);Able to understand and use Dyspnea scale;Able to check pulse independently  Increase Physical Activity;Understanding of Exercise Prescription;Increase Strength and Stamina;Able to understand and use rate of perceived exertion (RPE) scale;Knowledge and understanding of Target Heart Rate Range (THRR);Able to understand and use Dyspnea scale;Able to check pulse independently  Increase Physical Activity;Understanding of Exercise Prescription;Increase Strength and Stamina;Able to understand and use rate of perceived exertion (RPE) scale;Knowledge and understanding of Target Heart Rate Range (THRR);Able to understand and use Dyspnea scale;Able to check pulse independently   Comments  Reviewed RPE scale, THR and program prescription with pt today.  Pt voiced understanding and was given a copy of goals to take home.   Melanie Wolfe is off to a good start in rehab.  She has completed two full days of exercise.  We will continue to monitor her progress.   Melanie Wolfe is doing well with exercise.  She has been having some soreness after exercise from arm crank and weights. We are going to switch arm crank out for recumbent bike.  She is thinking of buying a treadmill and she belongs to the [facility].   She will be cleared to drive again soon so she be able to start going to again. Reviewed home exercise with pt today.  Pt plans to continue to walk at home and return to the [facility] (once cleared to drive) for exercise.  Reviewed THR, pulse, RPE, sign and symptoms, and when to call 911 or MD.  Also discussed weather considerations and indoor options.  Pt voiced understanding.  Melanie Wolfe is doing great in rehab. She is working  at level 3 on the recumbant bike and seems to like it much better than the arm crank. She is still trying to increase her activity levels at home but plans to  walk daily. She is still waiting to be cleared to drive.   Melanie Wolfe is doing well in rehab. She still likes the recumbant bike much better than the arm crank, she says she can breath better. She has increased to level 3 on hte Nu Step.    Expected Outcomes  Short: Use RPE daily to regulate intensity. Long: Follow program prescription in THR.  Short: Begin to increase workloads.  Long: Continue to follow program prescription.   Short: Start to add in home exercise routinely.  Long: Continue to exercise more on her own and increase chest mobility.   Short: continue to attend rehab regularly. Long: increase overall stamina and strength.   Short: add 3 days of exercise at home Long: become comfortable exercising independently.    Melanie Wolfe Name 03/01/18 1007 03/08/18 1643 03/23/18 0954         Exercise Goal Re-Evaluation   Exercise Goals Review  Increase Physical Activity;Increase Strength and Stamina;Understanding of Exercise Prescription  Increase Physical Activity;Increase Strength and Stamina;Understanding of Exercise Prescription  Increase Physical Activity;Increase Strength and Stamina;Understanding of Exercise Prescription     Comments  Melanie Wolfe is doing well in rehab.  She has been walking at home on her off days.  She is aiming to work her way up to three miles at home.  She still feels like she needs to use mroe arms at home.  She is planning to go to [facility] with son tomorrow to start rebuilding her muscles.   She is still struggling with her ankle and work on getting upstairs  to her bedroom again.   She has 14 steps to get up there.   She is going back to work part time. Reviewed home exercise with pt today.  Pt plans to continue to walk at home and return to [facility] for exercise.  Reviewed THR, pulse, RPE, sign and symptoms, NTG use, and when to call 911 or MD.  Also discussed weather considerations and indoor options.  Pt voiced  Melanie Wolfe has been doing well in rehab. She continues to arrive late to class but is  exercising on her off days.  She is up to 2.6 METs on the NuStep.  We will continue to monitor her progress.   Melanie Wolfe continues to do well in rehab.  She improved her post 6MWT by 335 ft.  We will continue to monitor her progression.      Expected Outcomes  Short: Add in home exercise.  Long: Continue to build strength and stamina.   Short: Continue to increase workloads.  Long: Continue to increase home exercise.   Short: Continue to increase workloads.  Long: Continue to exercise independently.         Discharge Exercise Prescription (Final Exercise Prescription Changes): Exercise Prescription Changes - 03/23/18 0900      Response to Exercise   Blood Pressure (Admit)  118/70    Blood Pressure (Exercise)  146/66    Blood Pressure (Exit)  112/60    Heart Rate (Admit)  57 bpm    Heart Rate (Exercise)  109 bpm    Heart Rate (Exit)  74 bpm    Rating of Perceived Exertion (Exercise)  13    Symptoms  none    Duration  Continue with 30 min of aerobic exercise without signs/symptoms of physical distress.    Intensity  THRR unchanged      Progression   Progression  Continue to progress workloads to maintain intensity without signs/symptoms of physical distress.    Average METs  2.16      Resistance Training   Training Prescription  Yes    Weight  3 lbs    Reps  10-15      Interval Training   Interval Training  No      Treadmill   MPH  2.2    Grade  0    Minutes  15    METs  2.68      Recumbant Bike   Level  2    Minutes  15      NuStep   Level  5    Minutes  15    METs  2.4      Arm Ergometer   Level  1    Minutes  15    METs  1.4      Home Exercise Plan   Plans to continue exercise at  [employer] (comment)    Frequency  Add 3 additional days to program exercise sessions.    Initial Home Exercises Provided  02/03/18       Nutrition:  Target Goals: Understanding of nutrition guidelines, daily intake of sodium <1568m, cholesterol <2072m calories 30% from fat  and 7% or less from saturated fats, daily to have 5 or more servings of fruits and vegetables.  Biometrics: Pre Biometrics - 01/03/18 1530      Pre Biometrics   Height  5' 3.4" (1.61 m)    Weight  140 lb 9.6 oz (63.8 kg)    Waist Circumference  32.25 inches    Hip Circumference  38.75 inches    Waist to Hip Ratio  0.83 %    BMI (Calculated)  24.6    Single Leg Stand  30 seconds      Post Biometrics - 03/22/18 1353       Post  Biometrics   Height  5' 3.4" (1.61 m)    Weight  135 lb (61.2 kg)    Waist Circumference  32 inches    Hip Circumference  37.25 inches    Waist to Hip Ratio  0.86 %    BMI (Calculated)  23.62    Single Leg Stand  30 seconds       Nutrition Therapy Plan and Nutrition Goals: Nutrition Therapy & Goals - 02/15/18 1208      Nutrition Therapy   Diet  TLC   follows a vegan diet   Drug/Food Interactions  Statins/Certain Fruits    Protein (specify units)  6-7    Fiber  25 grams    Whole Grain Foods  3 servings   chooses whole grains regularly   Saturated Fats  11 max. grams    Fruits and Vegetables  8 servings/day    Sodium  1500 grams      Personal Nutrition Goals   Nutrition Goal  Increase your dietary intake of unsaturated fats, which will improve your HDL. You can incorporate them by adding them to smoothies, eating them as snacks, adding them on to salads, etc.    Personal Goal #2  Aim to eat a variety of plant-based protein sources each day so that your body gets all of the amino acids it needs to build complete proteins and to prevent nutritional deficiencies    Personal Goal #3  Prioritize vegan sources of Vitamin B12 such as nutritional yeast    Comments  She has started to incorporate physical activity in addtion to following a vegan diet (with the exception of salmon once every 2 weeks) which has helped her improve her HgbA1c and reduce LDL. Her HDL could still be improved. Does not salt food and eats a high fiber diet      Intervention Plan    Intervention  Prescribe, educate and counsel regarding individualized specific dietary modifications aiming towards targeted core components such as weight, hypertension, lipid management, diabetes, heart failure and other comorbidities.    Expected Outcomes  Short Term Goal: A plan has been developed with personal nutrition goals set during dietitian appointment.;Long Term Goal: Adherence to prescribed nutrition plan.;Short Term Goal: Understand basic principles of dietary content, such as calories, fat, sodium, cholesterol and nutrients.       Nutrition Assessments: Nutrition Assessments - 03/29/18 0957      MEDFICTS Scores   Pre Score  7    Post Score  0    Score Difference  -7       Nutrition Goals Re-Evaluation: Nutrition Goals Re-Evaluation    Row Name 02/03/18 1038 02/15/18 1228 03/01/18 1019 03/24/18 1047       Goals   Current Weight  138 lb (62.6 kg)  -  -  -    Nutrition Goal  Meet with dietician next week  Increase your dietary intake of unsaturated fats, which will improve your HDL. You can incorporate them by adding them to smoothies, eating them as snacks, adding them on to salads, etc.  Increase undsaturated fats, more protein  Increase dietary intake of unsaturated fat to help raise HDL level; eat a variety of plant-based protein sources daily in order to  have a diet comprised of all essential amino acids, along with dietary sources of Vitamin B12    Comment  Appt scheduled for 8/6.  Melanie Wolfe is trying to lose weight. She is vegan and borderline diabetic.  She reads labels to limit her sodium and sugar intake.   Her overall dietary fat intake is fairly low based on diet recall. She reports to like chia seeds, flax seeds, hemp seeds, hummus, avocado, and almond butter. She has been incorporating salmon occasionally.  Melanie Wolfe has been trying to make changes in her diet.  She has added in salmon and avacados to diet.  She has tried the flax seeds and has used a few chia seeds.  She is  adding them to her smoothies along with veggie protein.  She is also planning to add in a vegetable drink in the afternoon as weel.   She is eating healthy fat sources such as flax seeds, hemp seeds, avocados and hummus regularly. She eats plant based protein sources like lentils hummus and hemp seeds. She has hit a wt loss plateau; has been walking multiple miles per day    Expected Outcome  Short: Meet with dietician.  Long: Continue to work on weight loss.   She will cook with and include sources of heart-healthy unsaturated fats in her diet regularly  Short: Continue to add in unsaturated fats.  Long: Continue to follow heart healthy diet.   She will add in an additional snack daily, as she feels she may not be eating enough on a daily basis. She will continue to include sources of unsaturated fats and plant-based protein sources for a more well-rounded and balanced plant-based diet      Personal Goal #2 Re-Evaluation   Personal Goal #2  -  Aim to eat a variety of plant-based protein sources each day so that your body gets all of the amino acids it needs to make complete proteins and to prevent nutritional deficiencies  -  -      Personal Goal #3 Re-Evaluation   Personal Goal #3  -  Prioritize vegan sources of Vitamin B12 such as nutritional yeast  -  -       Nutrition Goals Discharge (Final Nutrition Goals Re-Evaluation): Nutrition Goals Re-Evaluation - 03/24/18 1047      Goals   Nutrition Goal  Increase dietary intake of unsaturated fat to help raise HDL level; eat a variety of plant-based protein sources daily in order to have a diet comprised of all essential amino acids, along with dietary sources of Vitamin B12    Comment  She is eating healthy fat sources such as flax seeds, hemp seeds, avocados and hummus regularly. She eats plant based protein sources like lentils hummus and hemp seeds. She has hit a wt loss plateau; has been walking multiple miles per day    Expected Outcome  She will  add in an additional snack daily, as she feels she may not be eating enough on a daily basis. She will continue to include sources of unsaturated fats and plant-based protein sources for a more well-rounded and balanced plant-based diet       Psychosocial: Target Goals: Acknowledge presence or absence of significant depression and/or stress, maximize coping skills, provide positive support system. Participant is able to verbalize types and ability to use techniques and skills needed for reducing stress and depression.   Initial Review & Psychosocial Screening: Initial Psych Review & Screening - 01/03/18 1403      Initial  Review   Current issues with  None Identified      Family Dynamics   Good Support System?  Yes   Children     Barriers   Psychosocial barriers to participate in program  There are no identifiable barriers or psychosocial needs.;The patient should benefit from training in stress management and relaxation.      Screening Interventions   Interventions  Encouraged to exercise;Provide feedback about the scores to participant;To provide support and resources with identified psychosocial needs    Expected Outcomes  Short Term goal: Utilizing psychosocial counselor, staff and physician to assist with identification of specific Stressors or current issues interfering with healing process. Setting desired goal for each stressor or current issue identified.;Long Term Goal: Stressors or current issues are controlled or eliminated.;Short Term goal: Identification and review with participant of any Quality of Life or Depression concerns found by scoring the questionnaire.;Long Term goal: The participant improves quality of Life and PHQ9 Scores as seen by post scores and/or verbalization of changes       Quality of Life Scores:  Quality of Life - 03/29/18 0957      Quality of Life   Select  Quality of Life      Quality of Life Scores   Health/Function Pre  19.23 %     Health/Function Post  26 %    Health/Function % Change  35.21 %    Socioeconomic Pre  17.63 %    Socioeconomic Post  25.92 %    Socioeconomic % Change   47.02 %    Psych/Spiritual Pre  24.43 %    Psych/Spiritual Post  28.5 %    Psych/Spiritual % Change  16.66 %    Family Pre  25.3 %    Family Post  28.5 %    Family % Change  12.65 %    GLOBAL Pre  20.77 %    GLOBAL Post  26.19 %    GLOBAL % Change  26.1 %      Scores of 19 and below usually indicate a poorer quality of life in these areas.  A difference of  2-3 points is a clinically meaningful difference.  A difference of 2-3 points in the total score of the Quality of Life Index has been associated with significant improvement in overall quality of life, self-image, physical symptoms, and general health in studies assessing change in quality of life.  PHQ-9: Recent Review Flowsheet Data    Depression screen [facility] 2/9 03/29/2018 01/03/2018   Decreased Interest 0 0   Down, Depressed, Hopeless 0 0   PHQ - 2 Score 0 0   Altered sleeping 1 1   Tired, decreased energy 1 0   Change in appetite 1 0   Feeling bad or failure about yourself  0 0   Trouble concentrating 0 0   Moving slowly or fidgety/restless 0 0   Suicidal thoughts 0 0   PHQ-9 Score 3 1   Difficult doing work/chores Not difficult at all Not difficult at all     Interpretation of Total Score  Total Score Depression Severity:  1-4 = Minimal depression, 5-9 = Mild depression, 10-14 = Moderate depression, 15-19 = Moderately severe depression, 20-27 = Severe depression   Psychosocial Evaluation and Intervention: Psychosocial Evaluation - 02/22/18 1036      Psychosocial Evaluation & Interventions   Interventions  Stress management education;Relaxation education;Encouraged to exercise with the program and follow exercise prescription    Comments  Counselor met  with Ms. Jeannine Wolfe Melanie Wolfe) today for initial psychosocial evaluation.  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  Melanie Wolfe has a strong support system with a spouse of 57 years (who works out of state most of the time); (3) adult children and some friends and a sister who live locally.  Melanie Wolfe was diagnosed with Diabetes recently but is working hard to bring those scores down to get off the medication.  She reports sleeping "okay" with approximately 6 hours per night and she has a good appetite.  Calen denies a history of depression or anxiety or any current symptoms.  She is typically in a positive mood.  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  Dahna has goals to increase her muscle tone and get stronger overall.  She will be followed by staff.      Expected Outcomes  Short:  Melanie attended the stress management component of this program today and admits she needs to set better boundaries and limits with others to decrease her stress.  She will also benefit from consistent exercise for stress and to improve her muscle tone.  Long:  Ivis will continue to exercise and practice positive stress management strategies for her health and mental health.      Continue Psychosocial Services   Follow up required by staff       Psychosocial Re-Evaluation: Psychosocial Re-Evaluation    [place] Name 02/03/18 1041 03/01/18 1016           Psychosocial Re-Evaluation   Current issues with  Current Stress Concerns  Current Stress Concerns;Current Sleep Concerns      Comments  Melanie Wolfe is doing well mentally.  She is bored being at home and limited acitivity post surgery. She will be cleared to drive again soon. Her lessend activity is also bothering her sleep.   Melanie Wolfe is doing well mentally.  She had an off week last week feeling blue.  She had wanted to go to [place] with her family but she decided to stay home.  She has been cleared to drive and is going back to work some.  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  She is still not sleeping well, she is hoping that by increasing her activity she will start sleeping better.       Expected Outcomes  Short: Continue to build up activity levels.  Long: Continue to work towards more activity and back to normal.   Short: Continue to build activity up.  Long: Continue to work toward better sleep and coping with stress.       Interventions  Stress management education;Encouraged to attend Cardiac Rehabilitation for the exercise  Stress management education;Encouraged to attend Cardiac Rehabilitation for the exercise      Continue Psychosocial Services   -  Follow up required by staff        Initial Review   Source of Stress Concerns  Unable to participate in former interests or hobbies;Unable to perform yard/household activities  -         Psychosocial Discharge (Final Psychosocial Re-Evaluation): Psychosocial Re-Evaluation - 03/01/18 1016      Psychosocial Re-Evaluation   Current issues with  Current Stress Concerns;Current Sleep Concerns    Comments  Melanie Wolfe is doing well mentally.  She had an off week last week feeling blue.  She had  wanted to go to [place] with her family but she decided to stay home.  She has been cleared to drive and is going back to work some.  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  She is still not sleeping well, she is hoping that by increasing her activity she will start sleeping better.     Expected Outcomes  Short: Continue to build activity up.  Long: Continue to work toward better sleep and coping with stress.     Interventions  Stress management education;Encouraged to attend Cardiac Rehabilitation for the exercise    Continue Psychosocial Services   Follow up required by staff       Vocational Rehabilitation: Provide vocational rehab assistance to qualifying candidates.   Vocational Rehab Evaluation & Intervention: Vocational Rehab - 01/03/18 1411       Initial Vocational Rehab Evaluation & Intervention   Assessment shows need for Vocational Rehabilitation  No       Education: Education Goals: Education classes will be provided on a variety of topics geared toward better understanding of heart health and risk factor modification. Participant will state understanding/return demonstration of topics presented as noted by education test scores.  Learning Barriers/Preferences: Learning Barriers/Preferences - 01/03/18 1411      Learning Barriers/Preferences   Learning Barriers  None    Learning Preferences  None       Education Topics:  AED/CPR: - Group verbal and written instruction with the use of models to demonstrate the basic use of the AED with the basic ABC's of resuscitation.   Cardiac Rehab from 03/31/2018 in [facility] Cardiac and Pulmonary Rehab  Date  03/22/18  Educator  KS  Instruction Review Code  1- Verbalizes Understanding      General Nutrition Guidelines/Fats and Fiber: -Group instruction provided by verbal, written material, models and posters to present the general guidelines for heart healthy nutrition. Gives an explanation and review of dietary fats and fiber.   Cardiac Rehab from 03/31/2018 in [facility] Cardiac and Pulmonary Rehab  Date  03/15/18  Educator  LB  Instruction Review Code  1- Verbalizes Understanding      Controlling Sodium/Reading Food Labels: -Group verbal and written material supporting the discussion of sodium use in heart healthy nutrition. Review and explanation with models, verbal and written materials for utilization of the food label.   Cardiac Rehab from 03/31/2018 in [facility] Cardiac and Pulmonary Rehab  Date  03/24/18  Educator  LB  Instruction Review Code  1- Verbalizes Understanding      Exercise Physiology & General Exercise Guidelines: - Group verbal and written instruction with models to review the exercise physiology of the cardiovascular system and associated critical values. Provides  general exercise guidelines with specific guidelines to those with heart or lung disease.    Cardiac Rehab from 03/31/2018 in [facility] Cardiac and Pulmonary Rehab  Date  03/31/18  Educator  [facility]  Instruction Review Code  1- Verbalizes Understanding      Aerobic Exercise & Resistance Training: - Gives group verbal and written instruction on the various components of exercise. Focuses on aerobic and resistive training programs and the benefits of this training and how to safely progress through these programs..   Cardiac Rehab from 03/31/2018 in [facility] Cardiac and Pulmonary Rehab  Date  02/10/18  Educator  AS  Instruction Review Code  1- Verbalizes Understanding      Flexibility, Balance, Mind/Body Relaxation: Provides group verbal/written instruction on the benefits of flexibility and balance training,  including mind/body exercise modes such as yoga, pilates and tai chi.  Demonstration and skill practice provided.   Cardiac Rehab from 03/31/2018 in [facility] Cardiac and Pulmonary Rehab  Date  02/15/18  Educator  AS  Instruction Review Code  3- Needs Reinforcement [arrived late to class]      Stress and Anxiety: - Provides group verbal and written instruction about the health risks of elevated stress and causes of high stress.  Discuss the correlation between heart/lung disease and anxiety and treatment options. Review healthy ways to manage with stress and anxiety.   Cardiac Rehab from 03/31/2018 in [facility] Cardiac and Pulmonary Rehab  Date  02/22/18  Educator  [facility]  Instruction Review Code  1- Verbalizes Understanding      Depression: - Provides group verbal and written instruction on the correlation between heart/lung disease and depressed mood, treatment options, and the stigmas associated with seeking treatment.   Anatomy & Physiology of the Heart: - Group verbal and written instruction and models provide basic cardiac anatomy and physiology, with the coronary electrical and arterial systems.  Review of Valvular disease and Heart Failure   Cardiac Rehab from 03/31/2018 in [facility] Cardiac and Pulmonary Rehab  Date  02/24/18  Educator  CE  Instruction Review Code  1- Verbalizes Understanding      Cardiac Procedures: - Group verbal and written instruction to review commonly prescribed medications for heart disease. Reviews the medication, class of the drug, and side effects. Includes the steps to properly store meds and maintain the prescription regimen. (beta blockers and nitrates)   Cardiac Rehab from 03/31/2018 in [facility] Cardiac and Pulmonary Rehab  Date  03/17/18  Educator  CE  Instruction Review Code  1- Verbalizes Understanding      Cardiac Medications I: - Group verbal and written instruction to review commonly prescribed medications for heart disease. Reviews the medication, class of the drug, and side effects. Includes the steps to properly store meds and maintain the prescription regimen.   Cardiac Rehab from 03/31/2018 in [facility] Cardiac and Pulmonary Rehab  Date  03/01/18  Educator  SB  Instruction Review Code  1- Verbalizes Understanding      Cardiac Medications II: -Group verbal and written instruction to review commonly prescribed medications for heart disease. Reviews the medication, class of the drug, and side effects. (all other drug classes)    Go Sex-Intimacy & Heart Disease, Get SMART - Goal Setting: - Group verbal and written instruction through game format to discuss heart disease and the return to sexual intimacy. Provides group verbal and written material to discuss and apply goal setting through the application of the S.M.A.R.T. Method.   Cardiac Rehab from 03/31/2018 in [facility] Cardiac and Pulmonary Rehab  Date  03/17/18  Educator  CE  Instruction Review Code  1- Verbalizes Understanding      Other Matters of the Heart: - Provides group verbal, written materials and models to describe Stable Angina and Peripheral Artery. Includes description of the disease  process and treatment options available to the cardiac patient.   Cardiac Rehab from 03/31/2018 in [facility] Cardiac and Pulmonary Rehab  Date  02/24/18  Educator  CE  Instruction Review Code  1- Verbalizes Understanding      Exercise & Equipment Safety: - Individual verbal instruction and demonstration of equipment use and safety with use of the equipment.   Cardiac Rehab from 03/31/2018 in [facility] Cardiac and Pulmonary Rehab  Date  01/03/18  Educator  [facility]  Instruction Review Code  1- Verbalizes Understanding      Infection Prevention: - Provides verbal and written material to individual with discussion of infection control including proper hand washing and proper equipment cleaning during exercise session.   Cardiac Rehab from 03/31/2018 in [facility] Cardiac and Pulmonary Rehab  Date  01/03/18  Educator  [facility]  Instruction Review Code  1- Verbalizes Understanding      Falls Prevention: - Provides verbal and written material to individual with discussion of falls prevention and safety.   Cardiac Rehab from 03/31/2018 in [facility] Cardiac and Pulmonary Rehab  Date  01/03/18  Educator  [facility]  Instruction Review Code  1- Verbalizes Understanding      Diabetes: - Individual verbal and written instruction to review signs/symptoms of diabetes, desired ranges of glucose level fasting, after meals and with exercise. Acknowledge that pre and post exercise glucose checks will be done for 3 sessions at entry of program.   Cardiac Rehab from 03/31/2018 in [facility] Cardiac and Pulmonary Rehab  Date  01/03/18  Educator  SB  Instruction Review Code  1- Verbalizes Understanding      Know Your Numbers and Risk Factors: -Group verbal and written instruction about important numbers in your health.  Discussion of what are risk factors and how they play a role in the disease process.  Review of Cholesterol, Blood Pressure, Diabetes, and BMI and the role they play in your overall health.   Sleep Hygiene: -Provides group  verbal and written instruction about how sleep can affect your health.  Define sleep hygiene, discuss sleep cycles and impact of sleep habits. Review good sleep hygiene tips.    Cardiac Rehab from 03/31/2018 in [facility] Cardiac and Pulmonary Rehab  Date  03/08/18  Educator  [facility]  Instruction Review Code  1- Verbalizes Understanding      Other: -Provides group and verbal instruction on various topics (see comments)   Knowledge Questionnaire Score: Knowledge Questionnaire Score - 03/29/18 0957      Knowledge Questionnaire Score   Pre Score  22/26    Post Score  25/26   test reviewed with pt today      Core Components/Risk Factors/Patient Goals at Admission: Personal Goals and Risk Factors at Admission - 01/03/18 1403      Core Components/Risk Factors/Patient Goals on Admission    Weight Management  Yes;Weight Maintenance    Intervention  Weight Management: Develop a combined nutrition and exercise program designed to reach desired caloric intake, while maintaining appropriate intake of nutrient and fiber, sodium and fats, and appropriate energy expenditure required for the weight goal.;Weight Management: Provide education and appropriate resources to help participant work on and attain dietary goals.    Admit Weight  140 lb 9.6 oz (63.8 kg)    Expected Outcomes  Weight Maintenance: Understanding of the daily nutrition guidelines, which includes 25-35% calories from fat, 7% or less cal from saturated fats, less than 229m cholesterol, less than 1.5gm of sodium, & 5 or more servings of fruits and vegetables daily    Diabetes  Yes    Intervention  Provide education about signs/symptoms and action to take for hypo/hyperglycemia.;Provide education about proper nutrition, including hydration, and aerobic/resistive exercise prescription along with prescribed medications to achieve blood glucose in normal ranges: Fasting glucose 65-99 mg/dL    Expected Outcomes  Short Term: Participant verbalizes  understanding of the signs/symptoms and immediate care of hyper/hypoglycemia, proper foot care and importance of medication, aerobic/resistive exercise and nutrition plan for blood glucose control.;Long Term: Attainment of  HbA1C < 7%.    Heart Failure  Yes    Intervention  Provide a combined exercise and nutrition program that is supplemented with education, support and counseling about heart failure. Directed toward relieving symptoms such as shortness of breath, decreased exercise tolerance, and extremity edema.    Expected Outcomes  Improve functional capacity of life;Short term: Attendance in program 2-3 days a week with increased exercise capacity. Reported lower sodium intake. Reported increased fruit and vegetable intake. Reports medication compliance.;Short term: Daily weights obtained and reported for increase. Utilizing diuretic protocols set by physician.;Long term: Adoption of self-care skills and reduction of barriers for early signs and symptoms recognition and intervention leading to self-care maintenance.    Hypertension  Yes    Intervention  Provide education on lifestyle modifcations including regular physical activity/exercise, weight management, moderate sodium restriction and increased consumption of fresh fruit, vegetables, and low fat dairy, alcohol moderation, and smoking cessation.;Monitor prescription use compliance.    Expected Outcomes  Short Term: Continued assessment and intervention until BP is < 140/31m HG in hypertensive participants. < 130/838mHG in hypertensive participants with diabetes, heart failure or chronic kidney disease.;Long Term: Maintenance of blood pressure at goal levels.    Lipids  Yes    Intervention  Provide education and support for participant on nutrition & aerobic/resistive exercise along with prescribed medications to achieve LDL <7072mHDL >71m31m  Expected Outcomes  Short Term: Participant states understanding of desired cholesterol values and is  compliant with medications prescribed. Participant is following exercise prescription and nutrition guidelines.;Long Term: Cholesterol controlled with medications as prescribed, with individualized exercise RX and with personalized nutrition plan. Value goals: LDL < 70mg38mL > 40 mg.       Core Components/Risk Factors/Patient Goals Review:  Goals and Risk Factor Review    Row Name 02/03/18 1036 03/01/18 1021           Core Components/Risk Factors/Patient Goals Review   Personal Goals Review  Weight Management/Obesity;Diabetes;Hypertension;Lipids  Weight Management/Obesity;Diabetes;Hypertension;Lipids      Review  Melanie Wolfe with her weight, she is trying to get down to 118.  Today she was 137 lbs and has been steady.  We will start to increase her workloads to help with weight loss.  She is doing well with her blood sugars.  She does not check them very often at home.  Her lipids have been good.  She is borderline DM and her lipids recovered. Blood pressures have been good 120s/60s.  She does not check at home frequently.  She does have a cuff and uses it some with her son's help. She is going to start taking sugars and pressures some more at home to record to show to doctor.  Cindy see the endocrinologist tomorrow about diabetes. She is not checking them at home as her A1c is down to 5.3.  She is still not checking her blood pressures at home, but will talk to her sons about helping her with it.  She is doing well on her medications.  She has hit a plateau with her weight and hopes for the increase in exercise to help boost the weight loss.       Expected Outcomes  Short: Take BP and blood sugars more frequently and record.  Long: Continue monitor risk factors.   Short: Start taking BP at home and see doctor tomorrow.  Long: Continue to work on weight loss.          Core Components/Risk  Factors/Patient Goals at Discharge (Final Review):  Goals and Risk Factor Review - 03/01/18 1021       Core Components/Risk Factors/Patient Goals Review   Personal Goals Review  Weight Management/Obesity;Diabetes;Hypertension;Lipids    Review  Melanie Wolfe see the endocrinologist tomorrow about diabetes. She is not checking them at home as her A1c is down to 5.3.  She is still not checking her blood pressures at home, but will talk to her sons about helping her with it.  She is doing well on her medications.  She has hit a plateau with her weight and hopes for the increase in exercise to help boost the weight loss.     Expected Outcomes  Short: Start taking BP at home and see doctor tomorrow.  Long: Continue to work on weight loss.        ITP Comments: ITP Comments    Row Name 01/03/18 1344 01/19/18 0554 02/16/18 0841 03/16/18 0606 03/31/18 0926   ITP Comments  Medical review completed today. ITP sent to Dr Loleta Chance for review, changes as needed and signature.  Documentation can be found in [facility] 12/28/2017  30 day review. Continue with ITP unless directed changes per Medical Director review.   New to program  30 day review completed. ITP sent to Dr. Ramonita Lab, covering for Dr. Emily Filbert, Medical Director of Cardiac Rehab. Continue with ITP unless changes are made by physician  30 day review completed. ITP sent to Dr. Emily Filbert, Medical Director of Cardiac Rehab. Continue with ITP unless changes are made by physician  Discharge ITP sent and signed by Dr. Sabra Heck.  Discharge Summary routed to PCP and cardiologist.      Comments: Discharge ITP

## 2018-03-31 NOTE — Patient Instructions (Signed)
Discharge Patient Instructions  Patient Details  Name: Melanie Wolfe MRN: [id number] Date of Birth: 1946-06-17 Referring Provider:  Lolita Patella, MD   Number of Visits: 36/36  Reason for Discharge:  Patient reached a stable level of exercise. Patient independent in their exercise. Patient has met program and personal goals.  Smoking History:  Social History   Tobacco Use  Smoking Status Never Smoker  Smokeless Tobacco Never Used    Diagnosis:  S/P CABG x 4  Initial Exercise Prescription: Initial Exercise Prescription - 01/03/18 1500      Date of Initial Exercise RX and Referring Provider   Date  01/03/18    Referring Provider  Cloretta Ned MD      Treadmill   MPH  2.3    Grade  0.5    Minutes  15    METs  2.92      NuStep   Level  1    SPM  80    Minutes  15    METs  2.5      Arm Ergometer   Level  2    Watts  33    RPM  25    Minutes  15    METs  2.5      Prescription Details   Frequency (times per week)  2    Duration  Progress to 30 minutes of continuous aerobic without signs/symptoms of physical distress      Intensity   THRR 40-80% of Max Heartrate  104-135    Ratings of Perceived Exertion  11-13    Perceived Dyspnea  0-4      Progression   Progression  Continue to progress workloads to maintain intensity without signs/symptoms of physical distress.      Resistance Training   Training Prescription  Yes    Weight  3 lbs       Discharge Exercise Prescription (Final Exercise Prescription Changes): Exercise Prescription Changes - 03/23/18 0900      Response to Exercise   Blood Pressure (Admit)  118/70    Blood Pressure (Exercise)  146/66    Blood Pressure (Exit)  112/60    Heart Rate (Admit)  57 bpm    Heart Rate (Exercise)  109 bpm    Heart Rate (Exit)  74 bpm    Rating of Perceived Exertion (Exercise)  13    Symptoms  none    Duration  Continue with 30 min of aerobic exercise without signs/symptoms of physical  distress.    Intensity  THRR unchanged      Progression   Progression  Continue to progress workloads to maintain intensity without signs/symptoms of physical distress.    Average METs  2.16      Resistance Training   Training Prescription  Yes    Weight  3 lbs    Reps  10-15      Interval Training   Interval Training  No      Treadmill   MPH  2.2    Grade  0    Minutes  15    METs  2.68      Recumbant Bike   Level  2    Minutes  15      NuStep   Level  5    Minutes  15    METs  2.4      Arm Ergometer   Level  1    Minutes  15    METs  1.4  Home Exercise Plan   Plans to continue exercise at  [facility] (comment)    Frequency  Add 3 additional days to program exercise sessions.    Initial Home Exercises Provided  02/03/18       Functional Capacity: 6 Minute Walk    Row Name 01/03/18 1524 03/22/18 1347       6 Minute Walk   Phase  Initial  Discharge    Distance  1245 feet  1580 feet    Distance % Change  -  27 %    Distance Feet Change  -  335 ft    Walk Time  6 minutes  6 minutes    # of Rest Breaks  0  0    MPH  2.36  2.99    METS  2.97  3.75    RPE  11  12    VO2 Peak  10.41  13.13    Symptoms  No  No    Resting HR  73 bpm  69 bpm    Resting BP  126/64  118/70    Resting Oxygen Saturation   98 %  -    Exercise Oxygen Saturation  during 6 min walk  96 %  98 %    Max Ex. HR  93 bpm  109 bpm    Max Ex. BP  146/66  142/66    2 Minute Post BP  124/62  -       Quality of Life: Quality of Life - 03/29/18 0957      Quality of Life   Select  Quality of Life      Quality of Life Scores   Health/Function Pre  19.23 %    Health/Function Post  26 %    Health/Function % Change  35.21 %    Socioeconomic Pre  17.63 %    Socioeconomic Post  25.92 %    Socioeconomic % Change   47.02 %    Psych/Spiritual Pre  24.43 %    Psych/Spiritual Post  28.5 %    Psych/Spiritual % Change  16.66 %    Family Pre  25.3 %    Family Post  28.5 %     Family % Change  12.65 %    GLOBAL Pre  20.77 %    GLOBAL Post  26.19 %    GLOBAL % Change  26.1 %       Personal Goals: Goals established at orientation with interventions provided to work toward goal. Personal Goals and Risk Factors at Admission - 01/03/18 1403      Core Components/Risk Factors/Patient Goals on Admission    Weight Management  Yes;Weight Maintenance    Intervention  Weight Management: Develop a combined nutrition and exercise program designed to reach desired caloric intake, while maintaining appropriate intake of nutrient and fiber, sodium and fats, and appropriate energy expenditure required for the weight goal.;Weight Management: Provide education and appropriate resources to help participant work on and attain dietary goals.    Admit Weight  140 lb 9.6 oz (63.8 kg)    Expected Outcomes  Weight Maintenance: Understanding of the daily nutrition guidelines, which includes 25-35% calories from fat, 7% or less cal from saturated fats, less than 247m cholesterol, less than 1.5gm of sodium, & 5 or more servings of fruits and vegetables daily    Diabetes  Yes    Intervention  Provide education about signs/symptoms and action to take for hypo/hyperglycemia.;Provide education about proper nutrition,  including hydration, and aerobic/resistive exercise prescription along with prescribed medications to achieve blood glucose in normal ranges: Fasting glucose 65-99 mg/dL    Expected Outcomes  Short Term: Participant verbalizes understanding of the signs/symptoms and immediate care of hyper/hypoglycemia, proper foot care and importance of medication, aerobic/resistive exercise and nutrition plan for blood glucose control.;Long Term: Attainment of HbA1C < 7%.    Heart Failure  Yes    Intervention  Provide a combined exercise and nutrition program that is supplemented with education, support and counseling about heart failure. Directed toward relieving symptoms such as shortness of breath,  decreased exercise tolerance, and extremity edema.    Expected Outcomes  Improve functional capacity of life;Short term: Attendance in program 2-3 days a week with increased exercise capacity. Reported lower sodium intake. Reported increased fruit and vegetable intake. Reports medication compliance.;Short term: Daily weights obtained and reported for increase. Utilizing diuretic protocols set by physician.;Long term: Adoption of self-care skills and reduction of barriers for early signs and symptoms recognition and intervention leading to self-care maintenance.    Hypertension  Yes    Intervention  Provide education on lifestyle modifcations including regular physical activity/exercise, weight management, moderate sodium restriction and increased consumption of fresh fruit, vegetables, and low fat dairy, alcohol moderation, and smoking cessation.;Monitor prescription use compliance.    Expected Outcomes  Short Term: Continued assessment and intervention until BP is < 140/46m HG in hypertensive participants. < 130/883mHG in hypertensive participants with diabetes, heart failure or chronic kidney disease.;Long Term: Maintenance of blood pressure at goal levels.    Lipids  Yes    Intervention  Provide education and support for participant on nutrition & aerobic/resistive exercise along with prescribed medications to achieve LDL <7023mHDL >37m37m  Expected Outcomes  Short Term: Participant states understanding of desired cholesterol values and is compliant with medications prescribed. Participant is following exercise prescription and nutrition guidelines.;Long Term: Cholesterol controlled with medications as prescribed, with individualized exercise RX and with personalized nutrition plan. Value goals: LDL < 70mg75mL > 40 mg.        Personal Goals Discharge: Goals and Risk Factor Review - 03/01/18 1021      Core Components/Risk Factors/Patient Goals Review   Personal Goals Review  Weight  Management/Obesity;Diabetes;Hypertension;Lipids    Review  CindyJenny Reichmannthe endocrinologist tomorrow about diabetes. She is not checking them at home as her A1c is down to 5.3.  She is still not checking her blood pressures at home, but will talk to her sons about helping her with it.  She is doing well on her medications.  She has hit a plateau with her weight and hopes for the increase in exercise to help boost the weight loss.     Expected Outcomes  Short: Start taking BP at home and see doctor tomorrow.  Long: Continue to work on weight loss.        Exercise Goals and Review: Exercise Goals    Row Name 01/03/18 1530             Exercise Goals   Increase Physical Activity  Yes       Intervention  Provide advice, education, support and counseling about physical activity/exercise needs.;Develop an individualized exercise prescription for aerobic and resistive training based on initial evaluation findings, risk stratification, comorbidities and participant's personal goals.       Expected Outcomes  Short Term: Attend rehab on a regular basis to increase amount of physical activity.;Long Term: Add in home exercise  to make exercise part of routine and to increase amount of physical activity.;Long Term: Exercising regularly at least 3-5 days a week.       Increase Strength and Stamina  Yes       Intervention  Develop an individualized exercise prescription for aerobic and resistive training based on initial evaluation findings, risk stratification, comorbidities and participant's personal goals.;Provide advice, education, support and counseling about physical activity/exercise needs.       Expected Outcomes  Short Term: Increase workloads from initial exercise prescription for resistance, speed, and METs.;Short Term: Perform resistance training exercises routinely during rehab and add in resistance training at home;Long Term: Improve cardiorespiratory fitness, muscular endurance and strength as  measured by increased METs and functional capacity (6MWT)       Able to understand and use rate of perceived exertion (RPE) scale  Yes       Intervention  Provide education and explanation on how to use RPE scale       Expected Outcomes  Short Term: Able to use RPE daily in rehab to express subjective intensity level;Long Term:  Able to use RPE to guide intensity level when exercising independently       Knowledge and understanding of Target Heart Rate Range (THRR)  Yes       Intervention  Provide education and explanation of THRR including how the numbers were predicted and where they are located for reference       Expected Outcomes  Short Term: Able to use daily as guideline for intensity in rehab;Short Term: Able to state/look up THRR;Long Term: Able to use THRR to govern intensity when exercising independently       Able to check pulse independently  Yes       Intervention  Provide education and demonstration on how to check pulse in carotid and radial arteries.;Review the importance of being able to check your own pulse for safety during independent exercise       Expected Outcomes  Short Term: Able to explain why pulse checking is important during independent exercise;Long Term: Able to check pulse independently and accurately       Understanding of Exercise Prescription  Yes       Intervention  Provide education, explanation, and written materials on patient's individual exercise prescription       Expected Outcomes  Short Term: Able to explain program exercise prescription;Long Term: Able to explain home exercise prescription to exercise independently          Nutrition & Weight - Outcomes: Pre Biometrics - 01/03/18 1530      Pre Biometrics   Height  5' 3.4" (1.61 m)    Weight  140 lb 9.6 oz (63.8 kg)    Waist Circumference  32.25 inches    Hip Circumference  38.75 inches    Waist to Hip Ratio  0.83 %    BMI (Calculated)  24.6    Single Leg Stand  30 seconds      Post Biometrics  - 03/22/18 1353       Post  Biometrics   Height  5' 3.4" (1.61 m)    Weight  135 lb (61.2 kg)    Waist Circumference  32 inches    Hip Circumference  37.25 inches    Waist to Hip Ratio  0.86 %    BMI (Calculated)  23.62    Single Leg Stand  30 seconds       Nutrition: Nutrition Therapy & Goals - 02/15/18  1208      Nutrition Therapy   Diet  TLC   follows a vegan diet   Drug/Food Interactions  Statins/Certain Fruits    Protein (specify units)  6-7    Fiber  25 grams    Whole Grain Foods  3 servings   chooses whole grains regularly   Saturated Fats  11 max. grams    Fruits and Vegetables  8 servings/day    Sodium  1500 grams      Personal Nutrition Goals   Nutrition Goal  Increase your dietary intake of unsaturated fats, which will improve your HDL. You can incorporate them by adding them to smoothies, eating them as snacks, adding them on to salads, etc.    Personal Goal #2  Aim to eat a variety of plant-based protein sources each day so that your body gets all of the amino acids it needs to build complete proteins and to prevent nutritional deficiencies    Personal Goal #3  Prioritize vegan sources of Vitamin B12 such as nutritional yeast    Comments  She has started to incorporate physical activity in addtion to following a vegan diet (with the exception of salmon once every 2 weeks) which has helped her improve her HgbA1c and reduce LDL. Her HDL could still be improved. Does not salt food and eats a high fiber diet      Intervention Plan   Intervention  Prescribe, educate and counsel regarding individualized specific dietary modifications aiming towards targeted core components such as weight, hypertension, lipid management, diabetes, heart failure and other comorbidities.    Expected Outcomes  Short Term Goal: A plan has been developed with personal nutrition goals set during dietitian appointment.;Long Term Goal: Adherence to prescribed nutrition plan.;Short Term Goal:  Understand basic principles of dietary content, such as calories, fat, sodium, cholesterol and nutrients.       Nutrition Discharge: Nutrition Assessments - 03/29/18 0957      MEDFICTS Scores   Pre Score  7    Post Score  0    Score Difference  -7       Education Questionnaire Score: Knowledge Questionnaire Score - 03/29/18 0957      Knowledge Questionnaire Score   Pre Score  22/26    Post Score  25/26   test reviewed with pt today      Goals reviewed with patient; copy given to patient.

## 2018-07-24 ENCOUNTER — Emergency Department
Admission: EM | Admit: 2018-07-24 | Discharge: 2018-07-24 | Disposition: A | Discharge status: Home / Self Care | Payer: Medicare Other | Attending: Emergency Medicine | Admitting: Emergency Medicine

## 2018-07-24 ENCOUNTER — Other Ambulatory Visit: Payer: Self-pay

## 2018-07-24 DIAGNOSIS — E119 Type 2 diabetes mellitus without complications: Secondary | ICD-10-CM | POA: Insufficient documentation

## 2018-07-24 DIAGNOSIS — K5641 Fecal impaction: Secondary | ICD-10-CM

## 2018-07-24 DIAGNOSIS — Z951 Presence of aortocoronary bypass graft: Secondary | ICD-10-CM | POA: Insufficient documentation

## 2018-07-24 DIAGNOSIS — Z79899 Other long term (current) drug therapy: Secondary | ICD-10-CM | POA: Insufficient documentation

## 2018-07-24 DIAGNOSIS — Z7901 Long term (current) use of anticoagulants: Secondary | ICD-10-CM | POA: Insufficient documentation

## 2018-07-24 DIAGNOSIS — I110 Hypertensive heart disease with heart failure: Secondary | ICD-10-CM | POA: Diagnosis not present

## 2018-07-24 DIAGNOSIS — Z7984 Long term (current) use of oral hypoglycemic drugs: Secondary | ICD-10-CM | POA: Insufficient documentation

## 2018-07-24 DIAGNOSIS — I5032 Chronic diastolic (congestive) heart failure: Secondary | ICD-10-CM | POA: Diagnosis not present

## 2018-07-24 DIAGNOSIS — I252 Old myocardial infarction: Secondary | ICD-10-CM | POA: Diagnosis not present

## 2018-07-24 NOTE — ED Triage Notes (Signed)
Pt arrived via ems for report of impaction after having left carotid surgery last week

## 2018-07-24 NOTE — ED Provider Notes (Addendum)
[facility] Emergency Department Provider Note  ____________________________________________   First MD Initiated Contact with Patient 07/24/18 [phone]     (approximate)  I have reviewed the triage vital signs and the nursing notes.   HISTORY  Chief Complaint Fecal Impaction   HPI Melanie Wolfe is a 73 y.o. female with a history of CHF diabetes as well as recent and STEMI status post catheterization this past Thursday without any stent placement was presented emergency department with a fecal impaction.  She states that she was on the toilet for approximately 3 hours this morning, pushing and feeling a mass just at the rectum.  EMS was called and the patient is requesting a disimpaction.  Says that she had taking senna which she thinks is not working for her.  Says the docusate has worked in the past.   Past Medical History:  Diagnosis Date  . Chronic diastolic heart failure (HCC)   . Coronary artery disease due to lipid rich plaque   . DM II (diabetes mellitus, type II), controlled (HCC)   . HLD (hyperlipidemia)   . HTN (hypertension)   . NSTEMI (non-ST elevated myocardial infarction) (HCC)   . STEMI (ST elevation myocardial infarction) (HCC) 2004   s/p PCI to LAD    Patient Active Problem List   Diagnosis Date Noted  . Chronic diastolic heart failure (HCC) 01/03/2018  . Coronary artery disease involving coronary bypass graft of native heart without angina pectoris 11/22/2017  . 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 Essential hypertension 07/05/2017  . Hypercholesterolemia 07/05/2017  . STEMI (ST elevation myocardial infarction) (HCC) 07/06/2002    Past Surgical History:  Procedure Laterality Date  . CESAREAN SECTION     x3  . CORONARY ARTERY BYPASS GRAFT  10/27/2017   Procedure: CORONARY ARTERY BYPASS, USING ARTERIAL GRAFT(S); SINGLE ARTERIAL GRAFT, Left internal mammary artery harvest; Surgeon: Wyona AlmasPlichta, Ryan Patrick,  MD; Location: [facility]PER[facility] RO[facility]; Service: Cardiothoracic; Laterality: N/A;   . CORONARY ARTERY BYPASS GRAFT  10/27/2017   Procedure: CORONARY ARTERY BYPASS, USING VENOUS GRAFT(S) AND ARTERIAL GRAFT(S);SINGLE VEIN GRAFT (LIST IN ADDITION TO PRIMARY PROCEDURE); Surgeon: Wyona AlmasPlichta, Ryan Patrick, MD; Location: [facility]; Service: Cardiothoracic; Laterality: N/A;     Prior to Admission medications   Medication Sig Start Date End Date Taking? Authorizing Provider  acetaminophen (TYLENOL) 325 MG tablet Take 650 mg by mouth every 6 (six) hours as needed.    [provider]  apixaban (ELIQUIS) 5 MG TABS tablet Take 5 mg by mouth every 12 (twelve) hours.    [provider]  aspirin EC 81 MG tablet Take 81 mg by mouth daily.    [provider]  atorvastatin (LIPITOR) 40 MG tablet Take 40 mg by mouth daily.    [provider]  ezetimibe (ZETIA) 10 MG tablet Take by mouth. 12/16/17 12/16/18  [provider]  lisinopril (PRINIVIL,ZESTRIL) 2.5 MG tablet Take 2.5 mg by mouth daily.    [provider]  metFORMIN (GLUCOPHAGE) 500 MG tablet Take 500 mg by mouth 2 (two) times daily with a meal.    [provider]  metoprolol succinate (TOPROL-XL) 50 MG 24 hr tablet Take 50 mg by mouth every 12 (twelve) hours. Take with or immediately following a meal.    [provider]  pantoprazole (PROTONIX) 40 MG tablet Take 40 mg by mouth daily.    [provider]  traMADol (ULTRAM) 50 MG tablet Take 0.5 tablets (25 mg total) by mouth every  6 (six) hours as needed. 1/2 tab Patient not taking: Reported on 01/03/2018 11/05/17   Lorenso Quarry, NP    Allergies Heparin and Sulfa antibiotics  Family History  Problem Relation Age of Onset  . Stroke Father   . Heart attack Sister   . Heart attack Brother        s/p CABG  . Stroke Paternal Grandfather     Social History Social History   Tobacco Use  . Smoking status: Never Smoker  .  Smokeless tobacco: Never Used  Substance Use Topics  . Alcohol use: Never    Frequency: Never  . Drug use: Never    Review of Systems  Constitutional: No fever/chills Eyes: No visual changes. ENT: No sore throat. Cardiovascular: Denies chest pain. Respiratory: Denies shortness of breath. Gastrointestinal: No abdominal pain.  No nausea, no vomiting.  No diarrhea.   Genitourinary: Negative for dysuria. Musculoskeletal: Negative for back pain. Skin: Negative for rash. Neurological: Negative for headaches, focal weakness or numbness.   ____________________________________________   PHYSICAL EXAM:  VITAL SIGNS: ED Triage Vitals [07/24/18 0727]  Enc Vitals Group     BP (!) 144/84     Pulse Rate 61     Resp 12     Temp 98 F (36.7 C)     Temp Source Oral     SpO2 100 %     Weight 130 lb (59 kg)     Height 5\' 3"  (1.6 m)     Head Circumference      Peak Flow      Pain Score 5     Pain Loc      Pain Edu?      Excl. in GC?     Constitutional: Alert and oriented.  Patient appears moderately uncomfortable. Eyes: Conjunctivae are normal.  Head: Atraumatic. Nose: No congestion/rhinnorhea. Mouth/Throat: Mucous membranes are moist.  Neck: No stridor.  Ecchymotic area to the left side of the neck with overlying Steri-Strips from the carotid endarterectomy. Cardiovascular: Normal rate, regular rhythm. Grossly normal heart sounds. Respiratory: Normal respiratory effort.  No retractions. Lungs CTAB. Gastrointestinal: Soft and nontender. No distention. Musculoskeletal: No lower extremity tenderness nor edema.  No joint effusions. Neurologic:  Normal speech and language. No gross focal neurologic deficits are appreciated. Skin:  Skin is warm, dry and intact. No rash noted. Psychiatric: Mood and affect are normal. Speech and behavior are normal.  ____________________________________________   LABS (all labs ordered are listed, but only abnormal results are displayed)  Labs  Reviewed - No data to display ____________________________________________  EKG  ED ECG REPORT I, Arelia Longest, the attending physician, personally viewed and interpreted this ECG.   Date: 07/24/2018  EKG Time: 0725  Rate: 63  Rhythm: normal sinus rhythm  Axis: Normal  Intervals:Prolonged QTC at 566  ST&T Change: No ST segment ovation or depression.  No abnormal T wave inversion.  ED ECG REPORT I, Arelia Longest, the attending physician, personally viewed and interpreted this ECG.   Date: 07/24/2018  EKG Time: 0828  Rate: 61  Rhythm: normal sinus rhythm  Axis: normal  Intervals:none  ST&T Change: No ST segment elevation or depression.  Biphasic T waves in V5 and V6.  Biphasic T wave in aVF.  ____________________________________________  RADIOLOGY   ____________________________________________   PROCEDURES  Procedure(s) performed:    Fecal disimpaction Date/Time: 07/24/2018 8:21 AM Performed by: Myrna Blazer, MD Authorized by: Myrna Blazer, MD  Consent: Verbal consent obtained. Consent given by:  patient Patient understanding: patient states understanding of the procedure being performed Patient identity confirmed: verbally with patient Comments: Patient lying on her left side with knees drawn to her chest.  Large brown stool ball visualized at her rectum.  Able to remove a large amount of stool digitally.  Grossly brown.  Patient feels better after the procedure.     Critical Care performed:   ____________________________________________   INITIAL IMPRESSION / ASSESSMENT AND PLAN / ED COURSE  Pertinent labs & imaging results that were available during my care of the patient were reviewed by me and considered in my medical decision making (see chart for details).  DDX: Fecal impaction, constipation As part of my medical decision making, I reviewed the following data within the electronic MEDICAL RECORD NUMBER Notes from prior ED  visits  ----------------------------------------- 8:22 AM on 07/24/2018 -----------------------------------------  Patient feeling much improved after the disimpaction.  Daughter now at bedside and says that the patient had passed out this morning while moving her bowels.  The patient says that she remembers pushing and sitting in the bathroom for several hours try to move her bowels.  Says it was very hot and there and she became clammy.  However, did not experience any chest pain or palpitations.  First EKG with a prolonged QTC.  Daughter was questioning blood work.  However, with a recent cardiac catheter did not require stenting or not see how this would change management.  Furthermore, in the context of the history the patient almost certainly had a vasovagal episode.  Patient and family opted not to do blood work at this time.  Resolved prolonged QTC on second EKG.  Some nonspecific T wave changes without significant change from previous.  Nursing said that some of the leads removed and second EKG because of peculiar spacing during the first.  Do not see any signs of pathologic arrhythmia on his EKG.  We will proceed with plan for discharge.  Also discussed making sure the patient is up and walking as well as eating plenty of fiber.  We also discussed trying prune juice.  Patient and family say they are trying to avoid sodium in 1 avoid docusate. ____________________________________________   FINAL CLINICAL IMPRESSION(S) / ED DIAGNOSES  Fecal impaction   NEW MEDICATIONS STARTED DURING THIS VISIT:  New Prescriptions   No medications on file     Note:  This document was prepared using Dragon voice recognition software and may include unintentional dictation errors.     Myrna Blazer, MD 07/24/18 [phone]    Myrna Blazer, MD 07/24/18 [phone]

## 2018-08-31 ENCOUNTER — Emergency Department: Payer: Medicare Other

## 2018-08-31 ENCOUNTER — Other Ambulatory Visit: Payer: Self-pay

## 2018-08-31 ENCOUNTER — Emergency Department
Admission: EM | Admit: 2018-08-31 | Discharge: 2018-08-31 | Disposition: A | Discharge status: Home / Self Care | Payer: Medicare Other | Attending: Emergency Medicine | Admitting: Emergency Medicine

## 2018-08-31 ENCOUNTER — Encounter: Payer: Self-pay | Admitting: *Deleted

## 2018-08-31 DIAGNOSIS — R29810 Facial weakness: Secondary | ICD-10-CM | POA: Insufficient documentation

## 2018-08-31 DIAGNOSIS — I5032 Chronic diastolic (congestive) heart failure: Secondary | ICD-10-CM | POA: Diagnosis not present

## 2018-08-31 DIAGNOSIS — E119 Type 2 diabetes mellitus without complications: Secondary | ICD-10-CM | POA: Insufficient documentation

## 2018-08-31 DIAGNOSIS — Z7901 Long term (current) use of anticoagulants: Secondary | ICD-10-CM | POA: Insufficient documentation

## 2018-08-31 DIAGNOSIS — I110 Hypertensive heart disease with heart failure: Secondary | ICD-10-CM | POA: Insufficient documentation

## 2018-08-31 DIAGNOSIS — Z7982 Long term (current) use of aspirin: Secondary | ICD-10-CM | POA: Insufficient documentation

## 2018-08-31 DIAGNOSIS — Z79899 Other long term (current) drug therapy: Secondary | ICD-10-CM | POA: Insufficient documentation

## 2018-08-31 DIAGNOSIS — R202 Paresthesia of skin: Secondary | ICD-10-CM | POA: Insufficient documentation

## 2018-08-31 DIAGNOSIS — Z951 Presence of aortocoronary bypass graft: Secondary | ICD-10-CM | POA: Diagnosis not present

## 2018-08-31 DIAGNOSIS — I252 Old myocardial infarction: Secondary | ICD-10-CM | POA: Insufficient documentation

## 2018-08-31 DIAGNOSIS — Z7984 Long term (current) use of oral hypoglycemic drugs: Secondary | ICD-10-CM | POA: Diagnosis not present

## 2018-08-31 LAB — BASIC METABOLIC PANEL
Anion gap: 5 (ref 5–15)
BUN: 11 mg/dL (ref 8–23)
CO2: 30 mmol/L (ref 22–32)
Calcium: 9.5 mg/dL (ref 8.9–10.3)
Chloride: 107 mmol/L (ref 98–111)
Creatinine, Ser: 0.7 mg/dL (ref 0.44–1.00)
GFR calc Af Amer: >60 mL/min (ref >60)
GFR calc non Af Amer: >60 mL/min (ref >60)
Glucose, Bld: 131 mg/dL — ABNORMAL HIGH (ref 70–99)
POTASSIUM: 4.8 mmol/L (ref 3.5–5.1)
Sodium: 142 mmol/L (ref 135–145)

## 2018-08-31 LAB — CBC
HCT: 43 % (ref 36.0–46.0)
Hemoglobin: 14.5 g/dL (ref 12.0–15.0)
MCH: 32.7 pg (ref 26.0–34.0)
MCHC: 33.7 g/dL (ref 30.0–36.0)
MCV: 96.8 fL (ref 80.0–100.0)
Platelets: 180 10*3/uL (ref 150–400)
RBC: 4.44 MIL/uL (ref 3.87–5.11)
RDW: 12 % (ref 11.5–15.5)
WBC: 4.3 10*3/uL (ref 4.0–10.5)
nRBC: 0 % (ref 0.0–0.2)

## 2018-08-31 LAB — GLUCOSE, CAPILLARY: Glucose-Capillary: 79 mg/dL (ref 70–99)

## 2018-08-31 MED ORDER — LORAZEPAM 2 MG/ML IJ SOLN
1.0000 mg | Freq: Once | INTRAMUSCULAR | Status: AC
  Administered 2018-08-31: 1 mg via INTRAVENOUS
  Filled 2018-08-31: qty 1

## 2018-08-31 NOTE — Discharge Instructions (Addendum)
Please seek medical attention for any high fevers, chest pain, shortness of breath, change in behavior, persistent vomiting, bloody stool or any other new or concerning symptoms.  

## 2018-08-31 NOTE — ED Notes (Signed)
Patient in bed resting using cell phone. Patient's children at bedside. Will continue to monitor.

## 2018-08-31 NOTE — ED Notes (Signed)
Patient transported to MRI at this time. 

## 2018-08-31 NOTE — ED Notes (Signed)
EDP in with patient at this time.  

## 2018-08-31 NOTE — ED Provider Notes (Signed)
[facility] Emergency Department Provider Note   ____________________________________________   I have reviewed the triage vital signs and the nursing notes.   HISTORY  Chief Complaint Facial Droop   History limited by: Not Limited   HPI Melanie Wolfe is a 73 y.o. female who presents to the emergency department today because of concerns for facial droop.  Patient states that she noticed some tingling to the right side of her face yesterday however.  Did not last very long.  Today however her daughter noticed that she was having some right facial droop.  This has resolved by the time of my examination. Patient herself did not appreciate this.  She did not appreciate any change in her speech.  No vision changes or numbness in her arms or legs.  Patient denies any headache or trauma to her head.  Per medical record review patient has a history of CHF, CAD.   Past Medical History:  Diagnosis Date  . Chronic diastolic heart failure (HCC)   . Coronary artery disease due to lipid rich plaque   . DM II (diabetes mellitus, type II), controlled (HCC)   . HLD (hyperlipidemia)   . HTN (hypertension)   . NSTEMI (non-ST elevated myocardial infarction) (HCC)   . STEMI (ST elevation myocardial infarction) (HCC) 2004   s/p PCI to LAD    Patient Active Problem List   Diagnosis Date Noted  . Chronic diastolic heart failure (HCC) 01/03/2018  . Coronary artery disease involving coronary bypass graft of native heart without angina pectoris 11/22/2017  . 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 Essential hypertension 07/05/2017  . Hypercholesterolemia 07/05/2017  . STEMI (ST elevation myocardial infarction) (HCC) 07/06/2002    Past Surgical History:  Procedure Laterality Date  . CESAREAN SECTION     x3  . CORONARY ARTERY BYPASS GRAFT  10/27/2017   Procedure: CORONARY ARTERY BYPASS, USING ARTERIAL GRAFT(S); SINGLE ARTERIAL GRAFT, Left internal  mammary artery harvest; Surgeon: Wyona Almas, MD; Location: [facility]; Service: Cardiothoracic; Laterality: N/A;   . CORONARY ARTERY BYPASS GRAFT  10/27/2017   Procedure: CORONARY ARTERY BYPASS, USING VENOUS GRAFT(S) AND ARTERIAL GRAFT(S);SINGLE VEIN GRAFT (LIST IN ADDITION TO PRIMARY PROCEDURE); Surgeon: Wyona Almas, MD; Location: [facility]; Service: Cardiothoracic; Laterality: N/A;     Prior to Admission medications   Medication Sig Start Date End Date Taking? Authorizing Provider  acetaminophen (TYLENOL) 325 MG tablet Take 650 mg by mouth every 6 (six) hours as needed.    [provider]  apixaban (ELIQUIS) 5 MG TABS tablet Take 5 mg by mouth every 12 (twelve) hours.    [provider]  aspirin EC 81 MG tablet Take 81 mg by mouth daily.    [provider]  atorvastatin (LIPITOR) 40 MG tablet Take 40 mg by mouth daily.    [provider]  ezetimibe (ZETIA) 10 MG tablet Take by mouth. 12/16/17 12/16/18  [provider]  lisinopril (PRINIVIL,ZESTRIL) 2.5 MG tablet Take 2.5 mg by mouth daily.    [provider]  metFORMIN (GLUCOPHAGE) 500 MG tablet Take 500 mg by mouth 2 (two) times daily with a meal.    [provider]  metoprolol succinate (TOPROL-XL) 50 MG 24 hr tablet Take 50 mg by mouth every 12 (twelve) hours. Take with or immediately following a meal.    [provider]  pantoprazole (PROTONIX) 40 MG tablet Take 40 mg by mouth daily.    [provider]  traMADol (  ULTRAM) 50 MG tablet Take 0.5 tablets (25 mg total) by mouth every 6 (six) hours as needed. 1/2 tab Patient not taking: Reported on 01/03/2018 11/05/17   Lorenso Quarry, NP    Allergies Heparin and Sulfa antibiotics  Family History  Problem Relation Age of Onset  . Stroke Father   . Heart attack Sister   . Heart attack Brother        s/p CABG  . Stroke Paternal Grandfather     Social History Social  History   Tobacco Use  . Smoking status: Never Smoker  . Smokeless tobacco: Never Used  Substance Use Topics  . Alcohol use: Never    Frequency: Never  . Drug use: Never    Review of Systems Constitutional: No fever/chills Eyes: No visual changes. ENT: No sore throat. Cardiovascular: Denies chest pain. Respiratory: Denies shortness of breath. Gastrointestinal: No abdominal pain.  No nausea, no vomiting.  No diarrhea.   Genitourinary: Negative for dysuria. Musculoskeletal: Negative for back pain. Skin: Negative for rash. Neurological: Positive for right facial numbness, right facial droop. ____________________________________________   PHYSICAL EXAM:  VITAL SIGNS: ED Triage Vitals  Enc Vitals Group     BP 08/31/18 1637 (!) 200/76     Pulse Rate 08/31/18 1637 73     Resp --      Temp 08/31/18 1637 98.1 F (36.7 C)     Temp Source 08/31/18 1637 Oral     SpO2 08/31/18 1637 99 %     Weight 08/31/18 1637 127 lb (57.6 kg)     Height 08/31/18 1637  (1.6 m)     Head Circumference --      Peak Flow --      Pain Score 08/31/18 1649 0   Constitutional: Alert and oriented.  Eyes: Conjunctivae are normal.  ENT      Head: Normocephalic and atraumatic.      Nose: No congestion/rhinnorhea.      Mouth/Throat: Mucous membranes are moist.      Neck: No stridor. Hematological/Lymphatic/Immunilogical: No cervical lymphadenopathy. Cardiovascular: Normal rate, regular rhythm.  No murmurs, rubs, or gallops.  Respiratory: Normal respiratory effort without tachypnea nor retractions. Breath sounds are clear and equal bilaterally. No wheezes/rales/rhonchi. Gastrointestinal: Soft and non tender. No rebound. No guarding.  Genitourinary: Deferred Musculoskeletal: Normal range of motion in all extremities. No lower extremity edema. Neurologic:  Normal speech and language. PERRL. EOMI. Face symmetric. Tongue midline. No pronator drift. Sensation intact over upper and lower extremities.  Strength 5/5 in upper and lower extremities.  Skin:  Skin is warm, dry and intact. No rash noted. Psychiatric: Mood and affect are normal. Speech and behavior are normal. Patient exhibits appropriate insight and judgment.  ____________________________________________    LABS (pertinent positives/negatives)  CBC wbc 4.3, hgb 14.5, plt 180 BMP wnl except glu 131  ____________________________________________   EKG  I, Phineas Semen, attending physician, personally viewed and interpreted this EKG  EKG Time: 1649 Rate: 68 Rhythm: normal sinus rhythm Axis: normal Intervals: qtc 412 QRS: narrow ST changes: no st elevation Impression: normal ekg  ____________________________________________    RADIOLOGY  CT head Concern for age indeterminate lesion on right side  MRI No acute findings ____________________________________________   PROCEDURES  Procedures  ____________________________________________   INITIAL IMPRESSION / ASSESSMENT AND PLAN / ED COURSE  Pertinent labs & imaging results that were available during my care of the patient were reviewed by me and considered in my medical decision making (see chart for details).  Patient presented to the emergency department today because of concerns for an episode of right facial droop which had resolved by the time my exam.  CT was concern for possible age-indeterminate lesion.  MRI however did not show any acute findings.  At this point unclear etiology the patient's symptoms.  We did discuss possible TIA.  Patient has known carotid stenosis on the right side.  She is on Plavix and aspirin already.  I discussed with patient she needs to discuss with her doctor about possibly moving up surgery for right carotid stenosis.   ____________________________________________   FINAL CLINICAL IMPRESSION(S) / ED DIAGNOSES  Final diagnoses:  Facial droop     Note: This dictation was prepared with Dragon dictation. Any  transcriptional errors that result from this process are unintentional     Phineas Semen, MD 08/31/18 2354

## 2018-08-31 NOTE — ED Triage Notes (Signed)
Pt to ED reporting facial droop that she noted yesterday. No weakness noted upon assessment. Slight facial droop noted upon assessment. No changes in speech and no alteration in mental status. NIHH of 0 in triage.   Pt is HTN but reports a hx of the same. Pt has taken all medications as prescribed. Pt also requesting medications to bring BP down.

## 2018-08-31 NOTE — ED Notes (Signed)
Patient returned from MRI.

## 2018-08-31 NOTE — ED Notes (Addendum)
Patient states she walked 1.5 miles on treadmill and when talking to someone yesterday and felt strange in her face on right side. Patient has had open heart surgery in April and leftside carotid artery surgery in 2019.

## 2018-08-31 NOTE — ED Notes (Signed)
Patient states yesterday having some facial drooping. No facial drooping noted at this time. No complaints of headache, dizziness, or lightheaded.

## 2018-08-31 NOTE — ED Notes (Addendum)
First nurse note: pt states she was told by daughter at approx. 1600 that she was "talking weirdly out of the right side of her face." Pt's son present with pt states her face appears normal to him. Pt states she called her PCP in [place] and they told her to come to ED for head CT.

## 2020-02-27 ENCOUNTER — Other Ambulatory Visit: Payer: Self-pay

## 2020-02-27 ENCOUNTER — Ambulatory Visit (LOCAL_COMMUNITY_HEALTH_CENTER): Payer: Medicare HMO

## 2020-02-27 DIAGNOSIS — Z23 Encounter for immunization: Secondary | ICD-10-CM

## 2020-02-27 NOTE — Progress Notes (Signed)
Client presents requesting Td as injury to leg (from old pressure cooker). Client inquiring as to charge of vaccine if not covered by insurance. Counseled would be billed ($67). Per client, she does not want to pay for vaccine as thinks can get it for free at [facility] as where she gets her other medicines. Client making a call regarding getting a no out of pocket cost vaccine. RN counseled client that when call completed to return to Nurse Clinic workroom and press door bell as RN was moving to next client while she made her phone call. Jossie Ng, RN

## 2020-09-15 IMAGING — MR MR HEAD W/O CM
9 of 10 series · 42 of 48 positions shown · non-contrast
Comparison: Head CT 08/31/2018

CLINICAL DATA: Facial droop

EXAM:
MRI HEAD WITHOUT CONTRAST
TECHNIQUE: Multiplanar, multiecho pulse sequences of the brain and surrounding
structures were obtained without intravenous contrast.

[Series 2: ax dwi_tracew · axial · 3.0mm · 0.83mm/px · z∈[+14,+165]mm · 7 of 55 slices shown]
[im 1/55]
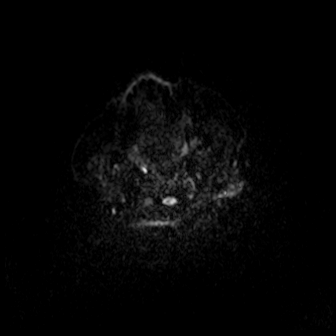
[im 10/55]
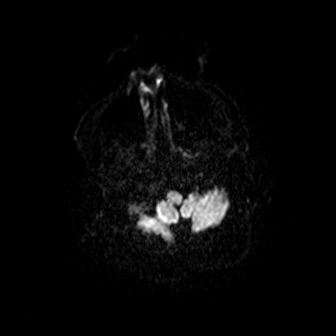
[im 19/55]
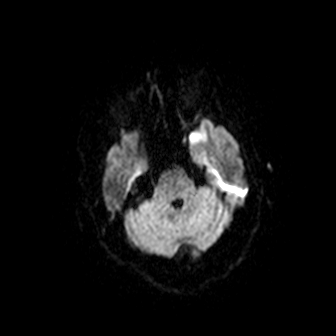
[im 28/55]
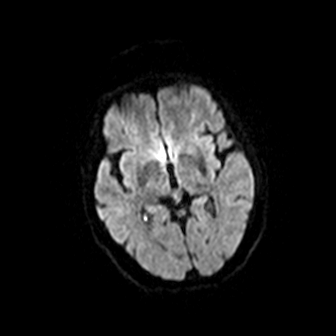
[im 37/55]
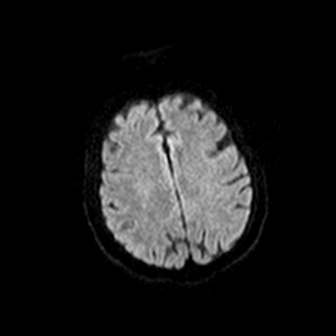
[im 46/55]
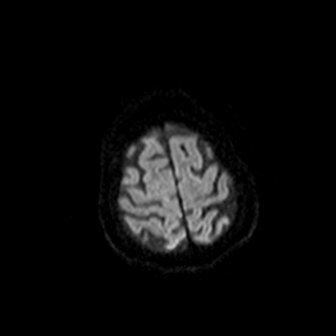
[im 55/55]
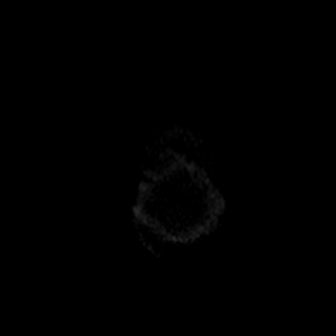

[Series 3: ax dwi_adc · axial · 3.0mm · 0.83mm/px · z∈[+14,+165]mm · 8 of 55 slices shown]
[im 1/55]
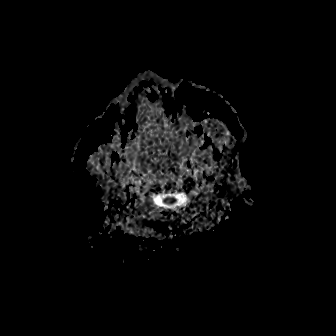
[im 8/55]
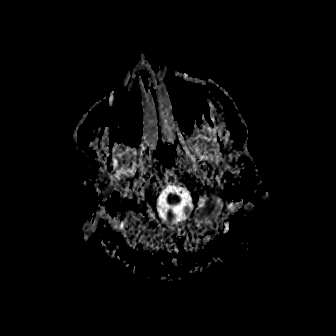
[im 16/55]
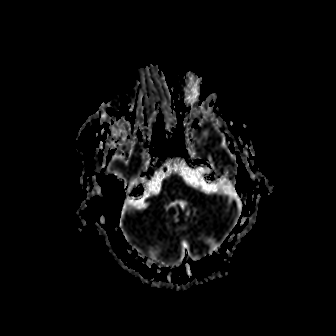
[im 24/55]
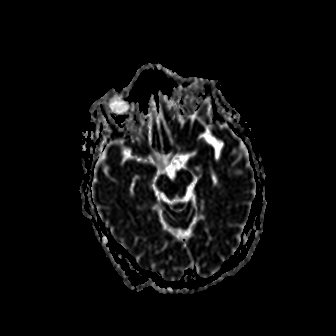
[im 31/55]
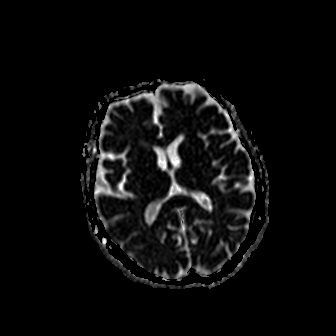
[im 39/55]
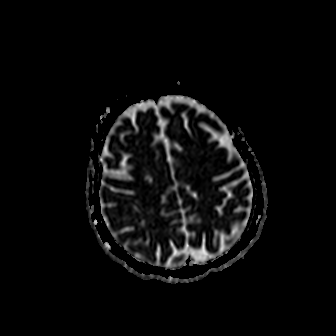
[im 47/55]
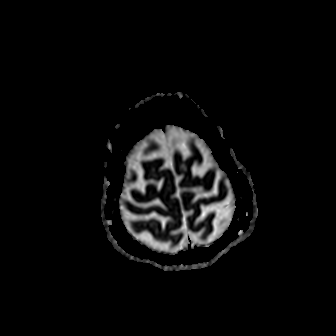
[im 55/55]
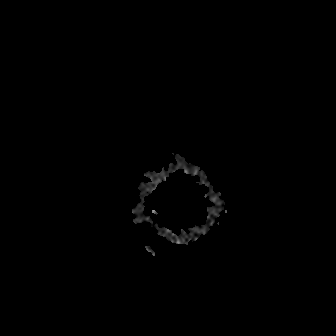

[Series 4: cor dwi_tracew · coronal · 5.0mm · 0.68mm/px · 5 of 39 slices shown]
[im 1/39]
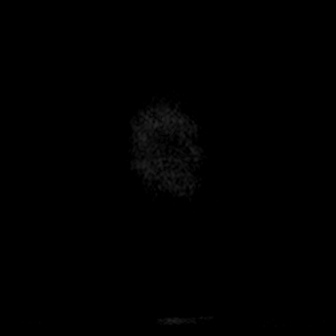
[im 10/39]
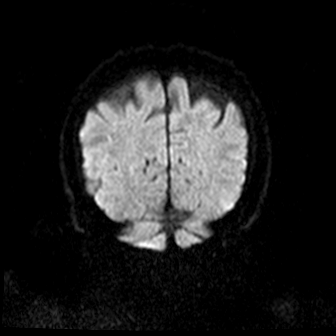
[im 20/39]
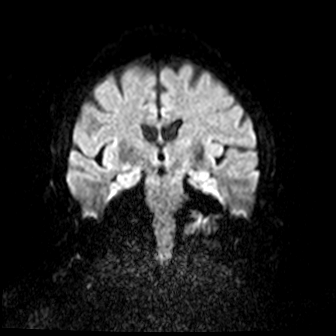
[im 29/39]
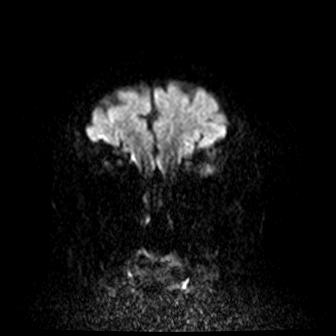
[im 39/39]
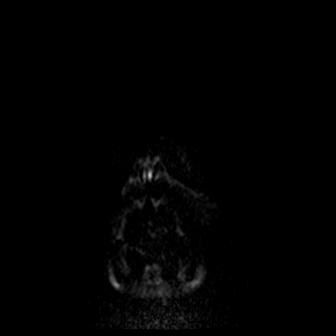

[Series 5: cor dwi_adc · coronal · 5.0mm · 0.68mm/px · 3 of 39 slices shown]
[im 1/39]
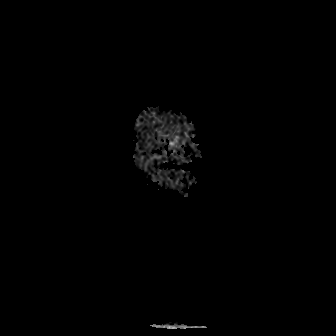
[im 10/39]
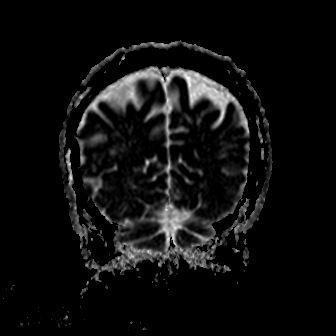
[im 20/39]
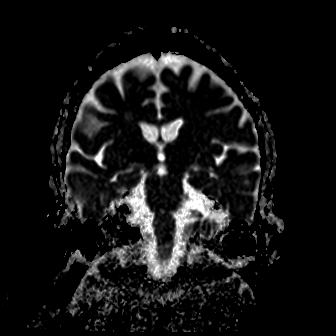

[Series 6: FLAIR · axial · 5.0mm · 1.20mm/px · z∈[+8,+154]mm · 4 of 27 slices shown]
[im 1/27]
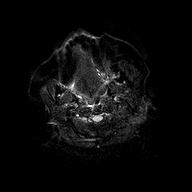
[im 9/27]
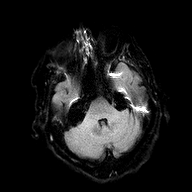
[im 18/27]
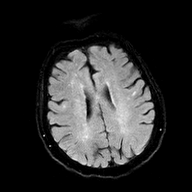
[im 27/27]
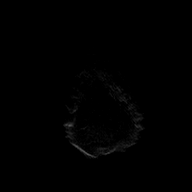

[Series 8: T2 · axial · 5.0mm · 0.45mm/px · z∈[+8,+154]mm · 4 of 27 slices shown (1 of 2)]
[im 1/27]
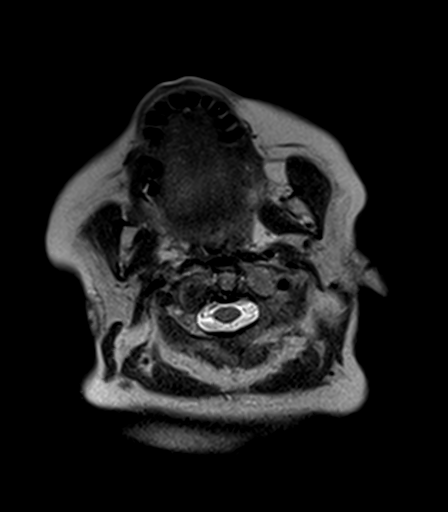
[im 9/27]
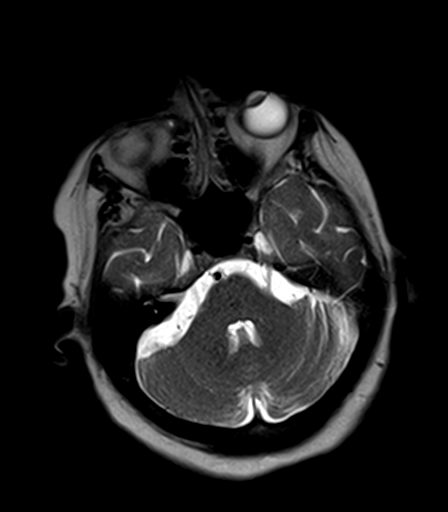
[im 18/27]
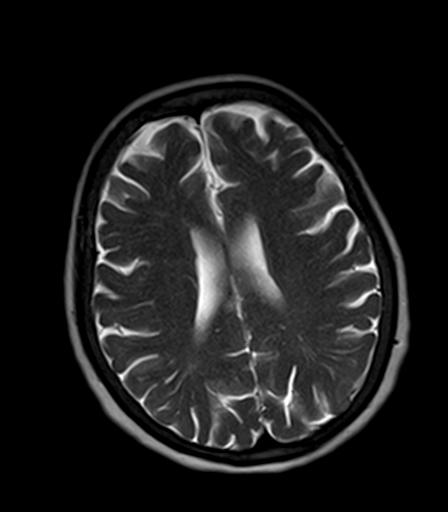
[im 27/27]
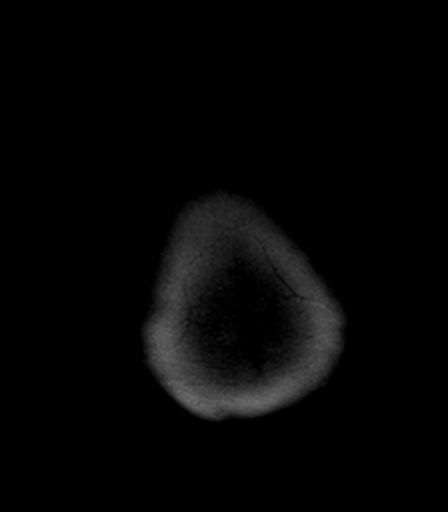

[Series 9: T1 · axial · 5.0mm · 0.90mm/px · z∈[+8,+154]mm · 4 of 27 slices shown (1 of 2)]
[im 1/27]
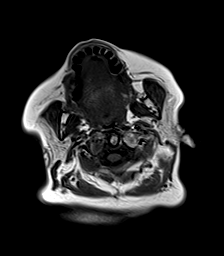
[im 9/27]
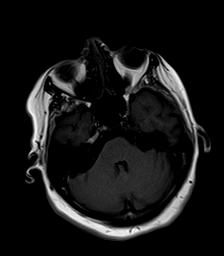
[im 18/27]
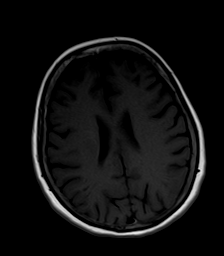
[im 27/27]
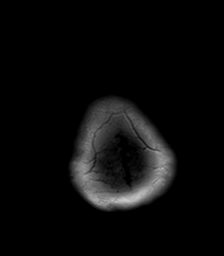

[Series 10: T1 · sagittal · 5.0mm · 0.94mm/px · 3 of 21 slices shown (2 of 2)]
[im 1/21]
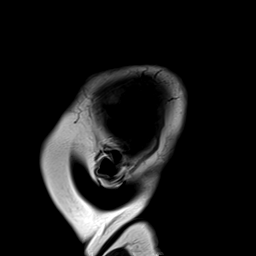
[im 11/21]
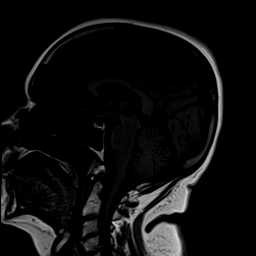
[im 21/21]
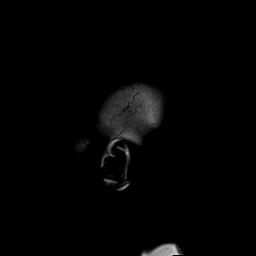

[Series 11: T2 · coronal · 5.0mm · 0.45mm/px · 4 of 31 slices shown (2 of 2)]
[im 1/31]
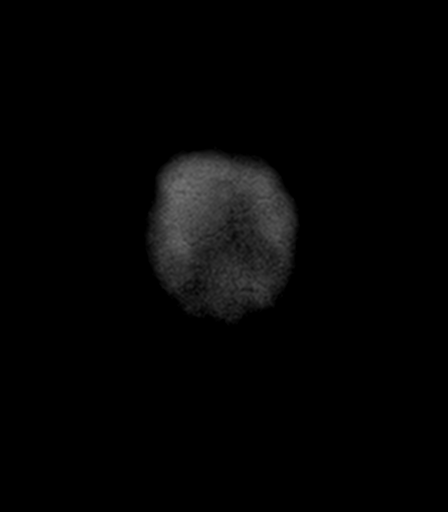
[im 11/31]
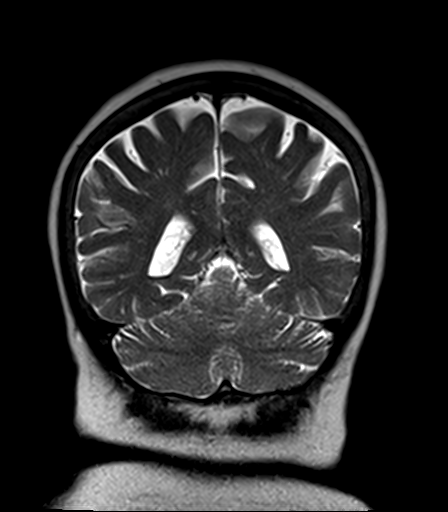
[im 21/31]
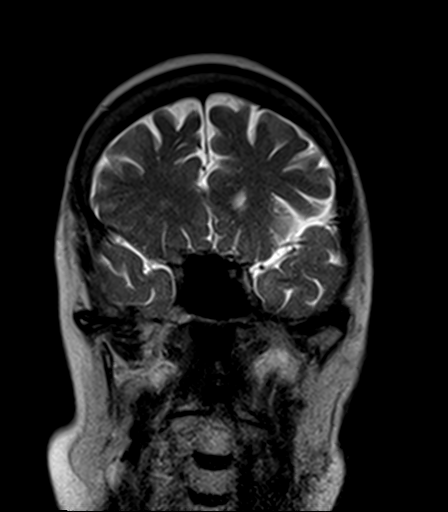
[im 31/31]
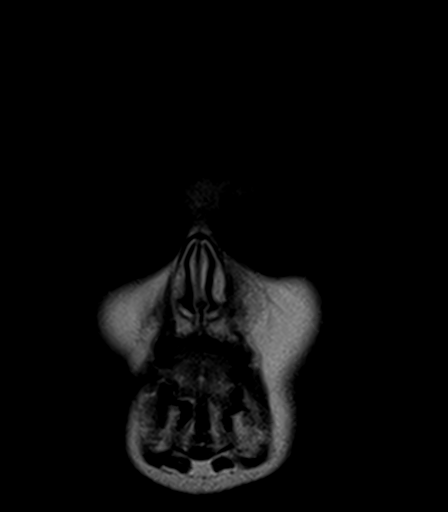

[42 of 48 positions shown; findings below may reference images not displayed]

FINDINGS: BRAIN: There is no acute infarct, acute hemorrhage, hydrocephalus or
extra-axial collection. The midline structures are normal. No
midline shift or other mass effect. Multifocal white matter
hyperintensity, most commonly due to chronic ischemic
microangiopathy. There is an old right corona radiata lacunar
infarct. The cerebral and cerebellar volume are age-appropriate.
Susceptibility-sensitive sequences show no chronic microhemorrhage
or superficial siderosis.

VASCULAR: Major intracranial arterial and venous sinus flow voids
are normal.

SKULL AND UPPER CERVICAL SPINE: Calvarial bone marrow signal is
normal. There is no skull base mass. Visualized upper cervical spine
and soft tissues are normal.

SINUSES/ORBITS: No fluid levels or advanced mucosal thickening. No
mastoid or middle ear effusion. The orbits are normal.
IMPRESSION: Chronic ischemic microangiopathy without acute intracranial
abnormality.

## 2021-08-04 ENCOUNTER — Encounter: Payer: Self-pay | Admitting: Emergency Medicine

## 2021-08-04 ENCOUNTER — Ambulatory Visit
Admission: EM | Admit: 2021-08-04 | Discharge: 2021-08-04 | Disposition: A | Discharge status: Home / Self Care | Payer: Medicare HMO | Attending: Physician Assistant | Admitting: Physician Assistant

## 2021-08-04 ENCOUNTER — Other Ambulatory Visit: Payer: Self-pay

## 2021-08-04 DIAGNOSIS — J209 Acute bronchitis, unspecified: Secondary | ICD-10-CM

## 2021-08-04 MED ORDER — PREDNISONE 50 MG PO TABS: ORAL_TABLET | ORAL | 0 refills | Status: DC

## 2021-08-04 MED ORDER — DOXYCYCLINE HYCLATE 100 MG PO CAPS: 100.0000 mg | ORAL_CAPSULE | Freq: Two times a day (BID) | ORAL | 0 refills | Status: DC

## 2021-08-04 NOTE — ED Triage Notes (Signed)
Pt here with a lingering cough from an URI for 9 days. Cough is wet, but not very productive.

## 2021-08-05 NOTE — ED Provider Notes (Signed)
Renaldo Fiddler    CSN: [id number] Arrival date & time: 08/04/21  1302      History   Chief Complaint Chief Complaint  Patient presents with   Cough    HPI Melanie Wolfe is a 76 y.o. female.   The history is provided by the patient. No language interpreter was used.  Cough Cough characteristics:  Non-productive Sputum characteristics:  Nondescript Severity:  Moderate Onset quality:  Gradual Duration:  9 days Timing:  Constant Progression:  Worsening Chronicity:  New Smoker: no   Context: upper respiratory infection   Relieved by:  Nothing Worsened by:  Nothing Ineffective treatments:  None tried Associated symptoms: no fever and no shortness of breath    Past Medical History:  Diagnosis Date   Chronic diastolic heart failure (HCC)    Coronary artery disease due to lipid rich plaque    DM II (diabetes mellitus, type II), controlled (HCC)    HLD (hyperlipidemia)    HTN (hypertension)    NSTEMI (non-ST elevated myocardial infarction) (HCC)    STEMI (ST elevation myocardial infarction) (HCC) 2004   s/p PCI to LAD    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 11/22/2017   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   Essential hypertension 07/05/2017   Hypercholesterolemia 07/05/2017   STEMI (ST elevation myocardial infarction) (HCC) 07/06/2002    Past Surgical History:  Procedure Laterality Date   CESAREAN SECTION     x3   CORONARY ARTERY BYPASS GRAFT  10/27/2017   Procedure: CORONARY ARTERY BYPASS, USING ARTERIAL GRAFT(S); SINGLE ARTERIAL GRAFT, Left internal mammary artery harvest; Surgeon: Wyona Almas, MD; Location: [facility]; Service: Cardiothoracic; Laterality: N/A;     CORONARY ARTERY BYPASS GRAFT  10/27/2017   Procedure: CORONARY ARTERY BYPASS, USING VENOUS  GRAFT(S) AND ARTERIAL GRAFT(S);SINGLE VEIN GRAFT (LIST IN ADDITION TO PRIMARY PROCEDURE); Surgeon: Wyona Almas, MD; Location: [facility]; Service: Cardiothoracic; Laterality: N/A;      OB History   No obstetric history on file.      Home Medications    Prior to Admission medications   Medication Sig Start Date End Date Taking? Authorizing Provider  doxycycline (VIBRAMYCIN) 100 MG capsule Take 1 capsule (100 mg total) by mouth 2 (two) times daily. 08/04/21  Yes Cheron Schaumann K, PA-C  predniSONE (DELTASONE) 50 MG tablet One tablet a day 08/04/21  Yes Elson Areas, PA-C  acetaminophen (TYLENOL) 325 MG tablet Take 650 mg by mouth every 6 (six) hours as needed.    [provider]  aspirin EC 81 MG tablet Take 81 mg by mouth daily.    [provider]  atorvastatin (LIPITOR) 40 MG tablet Take 40 mg by mouth every evening.     [provider]  clopidogrel (PLAVIX) 75 MG tablet Take 75 mg by mouth daily. 07/07/18   [provider]  docusate sodium (COLACE) 100 MG capsule Take 100 mg by mouth 2 (two) times daily.    [provider]  ezetimibe (ZETIA) 10 MG tablet Take 10 mg by mouth daily.  12/16/17 12/16/18  [provider]  lisinopril (PRINIVIL,ZESTRIL) 10 MG tablet Take 10 mg by mouth daily.     [provider]  metoprolol succinate (TOPROL-XL) 50 MG 24 hr tablet Take 50 mg by mouth every 12 (twelve) hours. Take with or immediately following a  meal.    [provider]    Family History Family History  Problem Relation Age of Onset   Stroke Father    Heart attack Sister    Heart attack Brother        s/p CABG   Stroke Paternal Grandfather     Social History Social History   Tobacco Use   Smoking status: Never   Smokeless tobacco: Never  Vaping Use   Vaping Use: Never used  Substance Use Topics   Alcohol use: Never   Drug use: Never     Allergies   Sulfa antibiotics   Review of  Systems Review of Systems  Constitutional:  Negative for fever.  Respiratory:  Positive for cough. Negative for shortness of breath.     Physical Exam Triage Vital Signs ED Triage Vitals  Enc Vitals Group     BP 08/04/21 1342 (!) 172/89     Pulse Rate 08/04/21 1342 84     Resp 08/04/21 1342 20     Temp 08/04/21 1342 98.1 F (36.7 C)     Temp src --      SpO2 08/04/21 1342 98 %     Weight --      Height --      Head Circumference --      Peak Flow --      Pain Score 08/04/21 1344 0     Pain Loc --      Pain Edu? --      Excl. in GC? --    No data found.  Updated Vital Signs BP (!) 172/89    Pulse 84    Temp 98.1 F (36.7 C)    Resp 20    SpO2 98%   Visual Acuity Right Eye Distance:   Left Eye Distance:   Bilateral Distance:    Right Eye Near:   Left Eye Near:    Bilateral Near:     Physical Exam Vitals reviewed.  Constitutional:      Appearance: Normal appearance.  HENT:     Right Ear: Tympanic membrane normal.     Left Ear: Tympanic membrane normal.     Nose: Nose normal.     Mouth/Throat:     Mouth: Mucous membranes are moist.  Cardiovascular:     Rate and Rhythm: Normal rate.     Pulses: Normal pulses.  Pulmonary:     Effort: Pulmonary effort is normal.  Abdominal:     General: Abdomen is flat.  Musculoskeletal:        General: Normal range of motion.  Skin:    General: Skin is warm.  Neurological:     General: No focal deficit present.     Mental Status: She is alert.  Psychiatric:        Mood and Affect: Mood normal.     UC Treatments / Results  Labs (all labs ordered are listed, but only abnormal results are displayed) Labs Reviewed - No data to display  EKG   Radiology No results found.  Procedures Procedures (including critical care time)  Medications Ordered in UC Medications - No data to display  Initial Impression / Assessment and Plan / UC Course  I have reviewed the triage vital signs and the nursing  notes.  Pertinent labs & imaging results that were available during my care of the patient were reviewed by me and considered in my medical decision making (see chart for details).     MDM:  Pt  given rx for prednisone and doxycycline Pt advised to follow up with her MD for recheck  Final Clinical Impressions(s) / UC Diagnoses   Final diagnoses:  Acute bronchitis, unspecified organism   Discharge Instructions   None    ED Prescriptions     Medication Sig Dispense Auth. Provider   predniSONE (DELTASONE) 50 MG tablet One tablet a day 5 tablet Cheron SchaumannSofia, Keanan Melander K, PA-C   doxycycline (VIBRAMYCIN) 100 MG capsule Take 1 capsule (100 mg total) by mouth 2 (two) times daily. 20 capsule Elson AreasSofia, Zamar Odwyer K, [place]PA-C      PDMP not reviewed this encounter.   Elson AreasSofia, Vint Pola K, [place]-C 08/05/21 2011

## 2022-07-02 ENCOUNTER — Other Ambulatory Visit: Payer: Self-pay

## 2022-07-02 ENCOUNTER — Emergency Department
Admission: EM | Admit: 2022-07-02 | Discharge: 2022-07-02 | Disposition: A | Discharge status: Home / Self Care | Payer: Medicare HMO | Attending: Emergency Medicine | Admitting: Emergency Medicine

## 2022-07-02 ENCOUNTER — Encounter: Payer: Self-pay | Admitting: Emergency Medicine

## 2022-07-02 DIAGNOSIS — R300 Dysuria: Secondary | ICD-10-CM | POA: Diagnosis not present

## 2022-07-02 DIAGNOSIS — E119 Type 2 diabetes mellitus without complications: Secondary | ICD-10-CM | POA: Diagnosis not present

## 2022-07-02 DIAGNOSIS — U071 COVID-19: Secondary | ICD-10-CM | POA: Insufficient documentation

## 2022-07-02 DIAGNOSIS — I110 Hypertensive heart disease with heart failure: Secondary | ICD-10-CM | POA: Insufficient documentation

## 2022-07-02 DIAGNOSIS — R55 Syncope and collapse: Secondary | ICD-10-CM | POA: Insufficient documentation

## 2022-07-02 DIAGNOSIS — I5030 Unspecified diastolic (congestive) heart failure: Secondary | ICD-10-CM | POA: Diagnosis not present

## 2022-07-02 DIAGNOSIS — M791 Myalgia, unspecified site: Secondary | ICD-10-CM | POA: Diagnosis present

## 2022-07-02 LAB — RESP PANEL BY RT-PCR (RSV, FLU A&B, COVID)  RVPGX2
Influenza A by PCR: NEGATIVE
Influenza B by PCR: NEGATIVE
Resp Syncytial Virus by PCR: NEGATIVE
SARS Coronavirus 2 by RT PCR: POSITIVE — AB

## 2022-07-02 LAB — URINALYSIS, ROUTINE W REFLEX MICROSCOPIC
Bilirubin Urine: NEGATIVE
Glucose, UA: NEGATIVE mg/dL
Hgb urine dipstick: NEGATIVE
Ketones, ur: NEGATIVE mg/dL
Leukocytes,Ua: NEGATIVE
Nitrite: NEGATIVE
Protein, ur: NEGATIVE mg/dL
Specific Gravity, Urine: 1.02 (ref 1.005–1.030)
pH: 5 (ref 5.0–8.0)

## 2022-07-02 LAB — BASIC METABOLIC PANEL
Anion gap: 8 (ref 5–15)
BUN: 12 mg/dL (ref 8–23)
CO2: 25 mmol/L (ref 22–32)
Calcium: 8.5 mg/dL — ABNORMAL LOW (ref 8.9–10.3)
Chloride: 104 mmol/L (ref 98–111)
Creatinine, Ser: 0.8 mg/dL (ref 0.44–1.00)
GFR, Estimated: >60 mL/min (ref >60)
Glucose, Bld: 132 mg/dL — ABNORMAL HIGH (ref 70–99)
Potassium: 3.9 mmol/L (ref 3.5–5.1)
Sodium: 137 mmol/L (ref 135–145)

## 2022-07-02 LAB — CBC
HCT: 38.5 % (ref 36.0–46.0)
Hemoglobin: 13.2 g/dL (ref 12.0–15.0)
MCH: 32.7 pg (ref 26.0–34.0)
MCHC: 34.3 g/dL (ref 30.0–36.0)
MCV: 95.3 fL (ref 80.0–100.0)
Platelets: 117 10*3/uL — ABNORMAL LOW (ref 150–400)
RBC: 4.04 MIL/uL (ref 3.87–5.11)
RDW: 11.1 % — ABNORMAL LOW (ref 11.5–15.5)
WBC: 3.6 10*3/uL — ABNORMAL LOW (ref 4.0–10.5)
nRBC: 0 % (ref 0.0–0.2)

## 2022-07-02 MED ORDER — ONDANSETRON 4 MG PO TBDP: 4.0000 mg | ORAL_TABLET | Freq: Three times a day (TID) | ORAL | 0 refills | Status: DC | PRN

## 2022-07-02 MED ORDER — ONDANSETRON 4 MG PO TBDP: 4.0000 mg | ORAL_TABLET | Freq: Three times a day (TID) | ORAL | 0 refills | Status: AC | PRN

## 2022-07-02 MED ORDER — SODIUM CHLORIDE 0.9 % IV BOLUS
500.0000 mL | Freq: Once | INTRAVENOUS | Status: AC
  Administered 2022-07-02: 500 mL via INTRAVENOUS

## 2022-07-02 MED ORDER — ONDANSETRON HCL 4 MG/2ML IJ SOLN
4.0000 mg | INTRAMUSCULAR | Status: AC
  Administered 2022-07-02: 4 mg via INTRAVENOUS
  Filled 2022-07-02: qty 2

## 2022-07-02 NOTE — ED Triage Notes (Signed)
Pt reports this am she was using the bathroom and when she got up she felt like a wobbly noodle and lowered herself down and called out for her son. Denies pain, SOB or other sx's.

## 2022-07-02 NOTE — ED Triage Notes (Signed)
Pt in via EMS from home with c/o near syncope. Pt stood up from using the restroom and felt lightheaded so wiggled herself down. Pt denies injuries. Pt also reports tested (+) for COVID last pm. Pt reports has not taken her meds in the last day. #20g to left AC. 139/58, 94% RA, HR 73, CBG 129

## 2022-07-02 NOTE — ED Provider Notes (Signed)
Care assumed of patient from outgoing provider.  See their note for initial history, exam and plan.  Does not want paxlovid.  Clinical Course as of 07/02/22 1101  Thu Jul 02, 2022  1100 1 day of myalgias and not feeling well, covid + at home. Near syncope w/o LOC. History of CHF and CAD.  [SM]    Clinical Course User Index [SM] Corena Herter, MD   UA without signs of urinary tract infection.  Patient ambulating in the emergency department without any difficulties.  No longer with lightheadedness or dizziness.  After IV fluids patient states that she is feeling better.  Conversation about Paxlovid but ultimately patient will not start on Paxlovid secondary to interactions with her Plavix.  Discussed follow-up with primary care physician and given return precautions.   Corena Herter, MD 07/02/22 1155

## 2022-07-02 NOTE — ED Provider Notes (Signed)
[facility] Provider Note    Event Date/Time   First MD Initiated Contact with Patient 07/02/22 1034     (approximate)   History   Near Syncope   HPI  Melanie Wolfe is a 76 y.o. female who started developing body aches and some chills yesterday.  She self tested positive on a COVID test at home.  This morning she also reports that she has been urinating more frequently and slightly nauseated with decreased intake.  She went to use the bathroom this morning and when she stood from the toilet got lightheaded, she did not pass out but had to lower herself to the floor and had to come to the ER to get evaluated.  Also she has noticed that she has had some abnormal urine odor and increased urinary frequency for the last several days and feels she should have a check for urine infection.  Patient also reports a history of carotid stenosis currently on clopidogrel and aspirin  She is not short of breath.  She has had a very slight cough.  No abdominal pain, stools been slightly loose.  Still able to eat and drink and reports some mild nausea   Past medical history includes previous STEMI, diastolic heart failure coronary disease type 2 diabetes hypertension  Physical Exam   Triage Vital Signs: ED Triage Vitals [07/02/22 0759]  Enc Vitals Group     BP (!) 145/69     Pulse Rate 81     Resp 20     Temp 98.7 F (37.1 C)     Temp Source Oral     SpO2 94 %     Weight 127 lb 13.9 oz (58 kg)     Height 5\' 3"  (1.6 m)     Head Circumference      Peak Flow      Pain Score 0     Pain Loc      Pain Edu?      Excl. in GC?     Most recent vital signs: Vitals:   07/02/22 0759  BP: (!) 145/69  Pulse: 81  Resp: 20  Temp: 98.7 F (37.1 C)  SpO2: 94%     General: Awake, no distress.  Very pleasant.  Daughter also at the bedside both very pleasant.  She is in no distress fully conversant.  Moves all extremities with normal strength, clear speech normal  extraocular movements, no facial droop.  No slurred speech. CV:  Good peripheral perfusion.  Normal heart tones and rate. Resp:  Normal effort.  Clear bilaterally.  Speaks in full clear sentences without distress.  Denies dyspnea.  No active coughing noted at this time Abd:  No distention.  Soft nontender Other:  No peripheral edema.  Mucous membranes slightly dry.    ED Results / Procedures / Treatments   Labs (all labs ordered are listed, but only abnormal results are displayed) Labs Reviewed  RESP PANEL BY RT-PCR (RSV, FLU A&B, COVID)  RVPGX2 - Abnormal; Notable for the following components:      Result Value   SARS Coronavirus 2 by RT PCR POSITIVE (*)    All other components within normal limits  BASIC METABOLIC PANEL - Abnormal; Notable for the following components:   Glucose, Bld 132 (*)    Calcium 8.5 (*)    All other components within normal limits  CBC - Abnormal; Notable for the following components:   WBC 3.6 (*)    RDW 11.1 (*)  Platelets 117 (*)    All other components within normal limits  URINE CULTURE  URINALYSIS, ROUTINE W REFLEX MICROSCOPIC     EKG  Interpreted by me at 9 AM heart rate 80 QRS 85 QTc 450 Normal sinus rhythm, probable LVH.  No evidence of acute ischemia.   RADIOLOGY   No hypoxia, no dyspnea.  Clear lung sounds on exam with normal work of breathing.  No noted indication for chest x-ray at this time   PROCEDURES:  Critical Care performed: no  Procedures   MEDICATIONS ORDERED IN ED: Medications  sodium chloride 0.9 % bolus 500 mL (500 mLs Intravenous New Bag/Given 07/02/22 1106)  ondansetron (ZOFRAN) injection 4 mg (4 mg Intravenous Given 07/02/22 1106)     IMPRESSION / MDM / ASSESSMENT AND PLAN / ED COURSE  I reviewed the triage vital signs and the nursing notes.                              Labs reviewed notable for leukopenia.  Mild thrombocytopenia  Differential diagnosis includes, but is not limited to, possible mild  dehydration, associated viral syndrome reports self testing positive for COVID at her own home which would explain symptoms of myalgias and fatigue.  She did suffer a near syncopal episode in the association with standing from the toilet which I suspect is likely orthostatic or related to mild dehydration.  At this point she does not have any neurologic symptoms and she denies that she had any slurred speech numbness or weakness in the arms or legs or any strokelike or TIA-like symptoms.  Does have a history of carotid stenosis, but at this juncture I do not believe that she is having active symptoms due to severe carotid stenosis but rather most likely due to mild dehydration in the setting of viral illness.  Lab work reassuring some mild reduced white cells and platelets which I suspect are likely viral suppression.  Patient's presentation is most consistent with acute complicated illness / injury requiring diagnostic workup.  Discussed risks benefits of Paxlovid with the patient, and after discussion and shared medical decision making patient would like to forego its use.  I think this is quite reasonable, my own inclination is at this point that there is likely little benefit and also associated side effects.     Plan is to hydrate with IV fluids, antiemetic, and oral intake and reassess symptoms thereafter.  If patient feels improved able to ambulate and stand up without noted symptomatology or ongoing concerns I think she would be appropriate for ongoing outpatient treatment and likely Zofran as antiemetic for home.  Patient is very much agreeable with this plan and return precautions.  Ongoing care reevaluation and follow-up on pending test including urinalysis and viral studies assigned to my partner Dr. Arnoldo Morale at 1110am   Clinical Course as of 07/02/22 1122  Thu Jul 02, 2022  1100 1 day of myalgias and not feeling well, covid + at home. Near syncope w/o LOC. History of CHF and CAD.  [SM]     Clinical Course User Index [SM] Corena Herter, MD     FINAL CLINICAL IMPRESSION(S) / ED DIAGNOSES   Final diagnoses:  COVID-19  Near syncope     Rx / DC Orders   ED Discharge Orders     None        Note:  This document was prepared using Dragon voice recognition software and may include  unintentional dictation errors.   Sharyn Creamer, MD 07/02/22 1122

## 2022-07-03 LAB — URINE CULTURE: Culture: <10000 — AB

## 2022-11-02 ENCOUNTER — Other Ambulatory Visit: Payer: Self-pay

## 2022-11-02 ENCOUNTER — Emergency Department: Payer: Medicare HMO

## 2022-11-02 ENCOUNTER — Emergency Department
Admission: EM | Admit: 2022-11-02 | Discharge: 2022-11-02 | Disposition: A | Discharge status: Home / Self Care | Payer: Medicare HMO | Attending: Emergency Medicine | Admitting: Emergency Medicine

## 2022-11-02 DIAGNOSIS — I5032 Chronic diastolic (congestive) heart failure: Secondary | ICD-10-CM | POA: Insufficient documentation

## 2022-11-02 DIAGNOSIS — Y92410 Unspecified street and highway as the place of occurrence of the external cause: Secondary | ICD-10-CM | POA: Diagnosis not present

## 2022-11-02 DIAGNOSIS — S0990XA Unspecified injury of head, initial encounter: Secondary | ICD-10-CM

## 2022-11-02 DIAGNOSIS — Z7902 Long term (current) use of antithrombotics/antiplatelets: Secondary | ICD-10-CM | POA: Insufficient documentation

## 2022-11-02 DIAGNOSIS — I110 Hypertensive heart disease with heart failure: Secondary | ICD-10-CM | POA: Diagnosis not present

## 2022-11-02 DIAGNOSIS — S0083XA Contusion of other part of head, initial encounter: Secondary | ICD-10-CM | POA: Diagnosis not present

## 2022-11-02 DIAGNOSIS — I2510 Atherosclerotic heart disease of native coronary artery without angina pectoris: Secondary | ICD-10-CM | POA: Diagnosis not present

## 2022-11-02 DIAGNOSIS — S80211A Abrasion, right knee, initial encounter: Secondary | ICD-10-CM | POA: Diagnosis not present

## 2022-11-02 DIAGNOSIS — S20212A Contusion of left front wall of thorax, initial encounter: Secondary | ICD-10-CM | POA: Diagnosis not present

## 2022-11-02 DIAGNOSIS — S299XXA Unspecified injury of thorax, initial encounter: Secondary | ICD-10-CM | POA: Diagnosis present

## 2022-11-02 MED ORDER — LIDOCAINE 5 % EX PTCH: 1.0000 | MEDICATED_PATCH | Freq: Two times a day (BID) | CUTANEOUS | 0 refills | Status: AC

## 2022-11-02 MED ORDER — LIDOCAINE 5 % EX PTCH
1.0000 | MEDICATED_PATCH | CUTANEOUS | Status: DC
  Administered 2022-11-02: 1 via TRANSDERMAL
  Filled 2022-11-02: qty 1
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
## 2022-11-02 NOTE — Discharge Instructions (Signed)
Your CT head and neck and xray chest did not show any acute injuries.  You may continue to use the Lidoderm patches to help with your pain.  Please remember to return the emergency department immediately if develop severe headache, changes in your vision, numbness, tingling, vomiting, or any other concerns given the risk of a delayed head bleed as we discussed.  Please return for any new, worsening, or change in symptoms or other concerns.  It was a pleasure caring for you today. Enjoy the beach!!
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
